# Patient Record
Sex: Female | Born: 1935 | Race: White | Hispanic: No | Smoking: Former smoker
Health system: Southern US, Community
[De-identification: ages and names within clinical notes are randomized; demographics above are authoritative.]

## PROBLEM LIST (undated history)

## (undated) DIAGNOSIS — C801 Malignant (primary) neoplasm, unspecified: Secondary | ICD-10-CM

## (undated) DIAGNOSIS — E039 Hypothyroidism, unspecified: Secondary | ICD-10-CM

## (undated) DIAGNOSIS — M1990 Unspecified osteoarthritis, unspecified site: Secondary | ICD-10-CM

## (undated) DIAGNOSIS — F329 Major depressive disorder, single episode, unspecified: Secondary | ICD-10-CM

## (undated) DIAGNOSIS — R51 Headache: Secondary | ICD-10-CM

## (undated) DIAGNOSIS — R42 Dizziness and giddiness: Secondary | ICD-10-CM

## (undated) DIAGNOSIS — R519 Headache, unspecified: Secondary | ICD-10-CM

## (undated) DIAGNOSIS — J45909 Unspecified asthma, uncomplicated: Secondary | ICD-10-CM

## (undated) DIAGNOSIS — F32A Depression, unspecified: Secondary | ICD-10-CM

## (undated) HISTORY — PX: COLONOSCOPY: SHX174

## (undated) HISTORY — PX: ABDOMINAL HYSTERECTOMY: SHX81

## (undated) HISTORY — PX: TONSILLECTOMY: SUR1361

---

## 2008-10-17 ENCOUNTER — Ambulatory Visit: Payer: Self-pay | Admitting: Internal Medicine

## 2009-10-19 ENCOUNTER — Ambulatory Visit: Payer: Self-pay | Admitting: Internal Medicine

## 2010-10-30 ENCOUNTER — Ambulatory Visit: Payer: Self-pay | Admitting: Internal Medicine

## 2011-12-17 ENCOUNTER — Ambulatory Visit: Payer: Self-pay | Admitting: Internal Medicine

## 2013-02-17 ENCOUNTER — Ambulatory Visit: Payer: Self-pay | Admitting: Internal Medicine

## 2013-02-24 ENCOUNTER — Ambulatory Visit: Payer: Self-pay | Admitting: Internal Medicine

## 2014-03-02 ENCOUNTER — Ambulatory Visit: Payer: Self-pay | Admitting: Internal Medicine

## 2015-03-09 ENCOUNTER — Other Ambulatory Visit: Payer: Self-pay | Admitting: Internal Medicine

## 2015-03-09 DIAGNOSIS — Z1231 Encounter for screening mammogram for malignant neoplasm of breast: Secondary | ICD-10-CM

## 2015-03-15 ENCOUNTER — Ambulatory Visit
Admission: RE | Admit: 2015-03-15 | Discharge: 2015-03-15 | Disposition: A | Discharge status: Home / Self Care | Payer: Medicare Other | Source: Ambulatory Visit | Attending: Internal Medicine | Admitting: Internal Medicine

## 2015-03-15 DIAGNOSIS — Z1231 Encounter for screening mammogram for malignant neoplasm of breast: Secondary | ICD-10-CM

## 2015-03-15 HISTORY — DX: Malignant (primary) neoplasm, unspecified: C80.1

## 2015-10-13 ENCOUNTER — Other Ambulatory Visit: Payer: Self-pay | Admitting: Internal Medicine

## 2015-10-13 DIAGNOSIS — M47812 Spondylosis without myelopathy or radiculopathy, cervical region: Secondary | ICD-10-CM

## 2015-10-13 DIAGNOSIS — M542 Cervicalgia: Secondary | ICD-10-CM

## 2015-10-17 ENCOUNTER — Ambulatory Visit
Admission: RE | Admit: 2015-10-17 | Discharge: 2015-10-17 | Disposition: A | Discharge status: Home / Self Care | Payer: Medicare Other | Source: Ambulatory Visit | Attending: Internal Medicine | Admitting: Internal Medicine

## 2015-10-17 DIAGNOSIS — G8929 Other chronic pain: Secondary | ICD-10-CM | POA: Diagnosis present

## 2015-10-17 DIAGNOSIS — M47812 Spondylosis without myelopathy or radiculopathy, cervical region: Secondary | ICD-10-CM | POA: Insufficient documentation

## 2015-10-17 DIAGNOSIS — M542 Cervicalgia: Secondary | ICD-10-CM | POA: Insufficient documentation

## 2015-10-17 DIAGNOSIS — M50323 Other cervical disc degeneration at C6-C7 level: Secondary | ICD-10-CM | POA: Insufficient documentation

## 2015-10-17 DIAGNOSIS — M1288 Other specific arthropathies, not elsewhere classified, other specified site: Secondary | ICD-10-CM | POA: Diagnosis present

## 2015-10-17 DIAGNOSIS — M50321 Other cervical disc degeneration at C4-C5 level: Secondary | ICD-10-CM | POA: Diagnosis not present

## 2016-03-08 ENCOUNTER — Other Ambulatory Visit: Payer: Self-pay | Admitting: Internal Medicine

## 2016-03-08 DIAGNOSIS — Z1231 Encounter for screening mammogram for malignant neoplasm of breast: Secondary | ICD-10-CM

## 2016-03-18 ENCOUNTER — Ambulatory Visit
Admission: RE | Admit: 2016-03-18 | Discharge: 2016-03-18 | Disposition: A | Discharge status: Home / Self Care | Payer: Medicare Other | Source: Ambulatory Visit | Attending: Internal Medicine | Admitting: Internal Medicine

## 2016-03-18 ENCOUNTER — Other Ambulatory Visit: Payer: Self-pay | Admitting: Internal Medicine

## 2016-03-18 DIAGNOSIS — Z1231 Encounter for screening mammogram for malignant neoplasm of breast: Secondary | ICD-10-CM | POA: Insufficient documentation

## 2016-08-16 ENCOUNTER — Encounter: Payer: Self-pay | Admitting: *Deleted

## 2016-08-20 NOTE — Discharge Instructions (Signed)
Cataract Surgery, Care After °Refer to this sheet in the next few weeks. These instructions provide you with information about caring for yourself after your procedure. Your health care provider may also give you more specific instructions. Your treatment has been planned according to current medical practices, but problems sometimes occur. Call your health care provider if you have any problems or questions after your procedure. °What can I expect after the procedure? °After the procedure, it is common to have: °· Itching. °· Discomfort. °· Fluid discharge. °· Sensitivity to light and to touch. °· Bruising. °Follow these instructions at home: °Eye Care  °· Check your eye every day for signs of infection. Watch for: °¨ Redness, swelling, or pain. °¨ Fluid, blood, or pus. °¨ Warmth. °¨ Bad smell. °Activity  °· Avoid strenuous activities, such as playing contact sports, for as long as told by your health care provider. °· Do not drive or operate heavy machinery until your health care provider approves. °· Do not bend or lift heavy objects . Bending increases pressure in the eye. You can walk, climb stairs, and do light household chores. °· Ask your health care provider when you can return to work. If you work in a dusty environment, you may be advised to wear protective eyewear for a period of time. °General instructions  °· Take or apply over-the-counter and prescription medicines only as told by your health care provider. This includes eye drops. °· Do not touch or rub your eyes. °· If you were given a protective shield, wear it as told by your health care provider. If you were not given a protective shield, wear sunglasses as told by your health care provider to protect your eyes. °· Keep the area around your eye clean and dry. Avoid swimming or allowing water to hit you directly in the face while showering until told by your health care provider. Keep soap and shampoo out of your eyes. °· Do not put a contact lens  into the affected eye or eyes until your health care provider approves. °· Keep all follow-up visits as told by your health care provider. This is important. °Contact a health care provider if: ° °· You have increased bruising around your eye. °· You have pain that is not helped with medicine. °· You have a fever. °· You have redness, swelling, or pain in your eye. °· You have fluid, blood, or pus coming from your incision. °· Your vision gets worse. °Get help right away if: °· You have sudden vision loss. °This information is not intended to replace advice given to you by your health care provider. Make sure you discuss any questions you have with your health care provider. °Document Released: 02/22/2005 Document Revised: 12/14/2015 Document Reviewed: 06/15/2015 °Elsevier Interactive Patient Education © 2017 [employer] ° ° ° ° °General Anesthesia, Adult, Care After °These instructions provide you with information about caring for yourself after your procedure. Your health care provider may also give you more specific instructions. Your treatment has been planned according to current medical practices, but problems sometimes occur. Call your health care provider if you have any problems or questions after your procedure. °What can I expect after the procedure? °After the procedure, it is common to have: °· Vomiting. °· A sore throat. °· Mental slowness. °It is common to feel: °· Nauseous. °· Cold or shivery. °· Sleepy. °· Tired. °· Sore or achy, even in parts of your body where you did not have surgery. °Follow these instructions at   home: °For at least 24 hours after the procedure:  °· Do not: °¨ Participate in activities where you could fall or become injured. °¨ Drive. °¨ Use heavy machinery. °¨ Drink alcohol. °¨ Take sleeping pills or medicines that cause drowsiness. °¨ Make important decisions or sign legal documents. °¨ Take care of children on your own. °· Rest. °Eating and drinking  °· If you vomit, drink  water, juice, or soup when you can drink without vomiting. °· Drink enough fluid to keep your urine clear or pale yellow. °· Make sure you have little or no nausea before eating solid foods. °· Follow the diet recommended by your health care provider. °General instructions  °· Have a responsible adult stay with you until you are awake and alert. °· Return to your normal activities as told by your health care provider. Ask your health care provider what activities are safe for you. °· Take over-the-counter and prescription medicines only as told by your health care provider. °· If you smoke, do not smoke without supervision. °· Keep all follow-up visits as told by your health care provider. This is important. °Contact a health care provider if: °· You continue to have nausea or vomiting at home, and medicines are not helpful. °· You cannot drink fluids or start eating again. °· You cannot urinate after 8-12 hours. °· You develop a skin rash. °· You have fever. °· You have increasing redness at the site of your procedure. °Get help right away if: °· You have difficulty breathing. °· You have chest pain. °· You have unexpected bleeding. °· You feel that you are having a life-threatening or urgent problem. °This information is not intended to replace advice given to you by your health care provider. Make sure you discuss any questions you have with your health care provider. °Document Released: 11/11/2000 Document Revised: 01/08/2016 Document Reviewed: 07/20/2015 °Elsevier Interactive Patient Education © 2017 [employer] ° °

## 2016-08-26 ENCOUNTER — Encounter
Admission: RE | Disposition: A | Discharge status: Home / Self Care | Payer: Self-pay | Source: Ambulatory Visit | Attending: Ophthalmology

## 2016-08-26 ENCOUNTER — Ambulatory Visit: Payer: Medicare Other | Admitting: Anesthesiology

## 2016-08-26 ENCOUNTER — Ambulatory Visit
Admission: RE | Admit: 2016-08-26 | Discharge: 2016-08-26 | Disposition: A | Discharge status: Home / Self Care | Payer: Medicare Other | Source: Ambulatory Visit | Attending: Ophthalmology | Admitting: Ophthalmology

## 2016-08-26 DIAGNOSIS — R51 Headache: Secondary | ICD-10-CM | POA: Insufficient documentation

## 2016-08-26 DIAGNOSIS — Z87891 Personal history of nicotine dependence: Secondary | ICD-10-CM | POA: Diagnosis not present

## 2016-08-26 DIAGNOSIS — H2511 Age-related nuclear cataract, right eye: Secondary | ICD-10-CM | POA: Diagnosis not present

## 2016-08-26 DIAGNOSIS — E039 Hypothyroidism, unspecified: Secondary | ICD-10-CM | POA: Insufficient documentation

## 2016-08-26 DIAGNOSIS — M1990 Unspecified osteoarthritis, unspecified site: Secondary | ICD-10-CM | POA: Insufficient documentation

## 2016-08-26 DIAGNOSIS — J45909 Unspecified asthma, uncomplicated: Secondary | ICD-10-CM | POA: Diagnosis not present

## 2016-08-26 HISTORY — DX: Hypothyroidism, unspecified: E03.9

## 2016-08-26 HISTORY — DX: Unspecified asthma, uncomplicated: J45.909

## 2016-08-26 HISTORY — DX: Headache: R51

## 2016-08-26 HISTORY — DX: Unspecified osteoarthritis, unspecified site: M19.90

## 2016-08-26 HISTORY — DX: Headache, unspecified: R51.9

## 2016-08-26 HISTORY — DX: Major depressive disorder, single episode, unspecified: F32.9

## 2016-08-26 HISTORY — DX: Depression, unspecified: F32.A

## 2016-08-26 HISTORY — PX: CATARACT EXTRACTION W/PHACO: SHX586

## 2016-08-26 HISTORY — DX: Dizziness and giddiness: R42

## 2016-08-26 SURGERY — PHACOEMULSIFICATION, CATARACT, WITH IOL INSERTION
Anesthesia: Monitor Anesthesia Care | Site: Eye | Laterality: Right | Wound class: Clean

## 2016-08-26 MED ORDER — CEFUROXIME OPHTHALMIC INJECTION 1 MG/0.1 ML
INJECTION | OPHTHALMIC | Status: DC | PRN
  Administered 2016-08-26: .3 mL via INTRACAMERAL

## 2016-08-26 MED ORDER — NA HYALUR & NA CHOND-NA HYALUR 0.4-0.35 ML IO KIT
PACK | INTRAOCULAR | Status: DC | PRN
  Administered 2016-08-26: 1 mL via INTRAOCULAR

## 2016-08-26 MED ORDER — FENTANYL CITRATE (PF) 100 MCG/2ML IJ SOLN
INTRAMUSCULAR | Status: DC | PRN
  Administered 2016-08-26: 50 ug via INTRAVENOUS

## 2016-08-26 MED ORDER — MIDAZOLAM HCL 2 MG/2ML IJ SOLN
INTRAMUSCULAR | Status: DC | PRN
  Administered 2016-08-26 (×2): 1 mg via INTRAVENOUS

## 2016-08-26 MED ORDER — LIDOCAINE HCL (PF) 4 % IJ SOLN
INTRAOCULAR | Status: DC | PRN
  Administered 2016-08-26: 1 mL via OPHTHALMIC

## 2016-08-26 MED ORDER — ARMC OPHTHALMIC DILATING DROPS
1.0000 "application " | OPHTHALMIC | Status: DC | PRN
  Administered 2016-08-26 (×3): 1 via OPHTHALMIC

## 2016-08-26 MED ORDER — MOXIFLOXACIN HCL 0.5 % OP SOLN
1.0000 [drp] | OPHTHALMIC | Status: DC | PRN
  Administered 2016-08-26 (×3): 1 [drp] via OPHTHALMIC

## 2016-08-26 MED ORDER — EPINEPHRINE PF 1 MG/ML IJ SOLN
INTRAOCULAR | Status: DC | PRN
  Administered 2016-08-26: 69 mL via OPHTHALMIC

## 2016-08-26 MED ORDER — TIMOLOL MALEATE 0.5 % OP SOLN
OPHTHALMIC | Status: DC | PRN
  Administered 2016-08-26: 1 [drp] via OPHTHALMIC

## 2016-08-26 SURGICAL SUPPLY — 29 items
APPLICATOR COTTON TIP 3IN (MISCELLANEOUS) ×3 IMPLANT
CANNULA ANT/CHMB 27GA (MISCELLANEOUS) ×3 IMPLANT
DISSECTOR HYDRO NUCLEUS 50X22 (MISCELLANEOUS) ×3 IMPLANT
GLOVE BIO SURGEON STRL SZ7 (GLOVE) ×3 IMPLANT
GLOVE SURG LX 6.5 MICRO (GLOVE) ×2
GLOVE SURG LX STRL 6.5 MICRO (GLOVE) ×1 IMPLANT
GOWN STRL REUS W/ TWL LRG LVL3 (GOWN DISPOSABLE) ×2 IMPLANT
GOWN STRL REUS W/TWL LRG LVL3 (GOWN DISPOSABLE) ×4
LENS IOL ACRSF IQ 11.5 (Intraocular Lens) ×1 IMPLANT
LENS IOL ACRYSOF IQ 11.5 (Intraocular Lens) ×3 IMPLANT
MARKER SKIN DUAL TIP RULER LAB (MISCELLANEOUS) ×3 IMPLANT
NEEDLE FILTER BLUNT 18X 1/2SAF (NEEDLE) ×4
NEEDLE FILTER BLUNT 18X1 1/2 (NEEDLE) ×2 IMPLANT
PACK CATARACT BRASINGTON (MISCELLANEOUS) ×3 IMPLANT
PACK EYE AFTER SURG (MISCELLANEOUS) ×3 IMPLANT
PACK OPTHALMIC (MISCELLANEOUS) ×3 IMPLANT
RING MALYGIN 7.0 (MISCELLANEOUS) IMPLANT
SOL BAL SALT 15ML (MISCELLANEOUS)
SOLUTION BAL SALT 15ML (MISCELLANEOUS) IMPLANT
SUT ETHILON 10-0 CS-B-6CS-B-6 (SUTURE)
SUT VICRYL  9 0 (SUTURE)
SUT VICRYL 9 0 (SUTURE) IMPLANT
SUTURE EHLN 10-0 CS-B-6CS-B-6 (SUTURE) IMPLANT
SYR 3ML LL SCALE MARK (SYRINGE) ×6 IMPLANT
SYR TB 1ML LUER SLIP (SYRINGE) ×3 IMPLANT
WATER STERILE IRR 250ML POUR (IV SOLUTION) ×3 IMPLANT
WATER STERILE IRR 500ML POUR (IV SOLUTION) IMPLANT
WICK EYE OCUCEL (MISCELLANEOUS) IMPLANT
WIPE NON LINTING 3.25X3.25 (MISCELLANEOUS) ×3 IMPLANT

## 2016-08-26 NOTE — Anesthesia Procedure Notes (Signed)
Procedure Name: MAC Date/Time: 08/26/2016 10:11 AM Performed by: Janna Arch Pre-anesthesia Checklist: Patient identified, Emergency Drugs available, Suction available and Patient being monitored Patient Re-evaluated:Patient Re-evaluated prior to inductionOxygen Delivery Method: Nasal cannula

## 2016-08-26 NOTE — Op Note (Signed)
Date of Surgery: 08/26/2016  PREOPERATIVE DIAGNOSES: Visually significant nuclear sclerotic cataract, right eye.  POSTOPERATIVE DIAGNOSES: Same  PROCEDURES PERFORMED: Cataract extraction with intraocular lens implant, right eye.  SURGEON: Almon Hercules, M.D.  ANESTHESIA: MAC and topical  IMPLANTS: [id number] +11.5 D  Implant Name Type Inv. Item Serial No. Manufacturer Lot No. LRB No. Used  LENS IOL ACRYSOF IQ 11.5 - [id number] Intraocular Lens LENS IOL ACRYSOF IQ 11.5 [id number] [employer]   Right 1     COMPLICATIONS: None.  DESCRIPTION OF PROCEDURE: Therapeutic options were discussed with the patient preoperatively, including a discussion of risks and benefits of surgery. Informed consent was obtained. An IOL-Master and immersion biometry were used to take the lens measurements, and a dilated fundus exam was performed within 6 months of the surgical date.  The patient was premedicated and brought to the operating room and placed on the operating table in the supine position. After adequate anesthesia, the patient was prepped and draped in the usual sterile ophthalmic fashion. A wire lid speculum was inserted and the microscope was positioned. A Superblade was used to create a paracentesis site at the limbus and a small amount of dilute preservative free lidocaine was instilled into the anterior chamber, followed by dispersive viscoelastic. A clear corneal incision was created temporally using a 2.4 mm keratome blade. Capsulorrhexis was then performed. In situ phacoemulsification was performed.  Cortical material was removed with the irrigation-aspiration unit. Dispersive viscoelastic was instilled to open the capsular bag. A posterior chamber intraocular lens with the specifications above was inserted and positioned. Irrigation-aspiration was used to remove all viscoelastic. Cefuroxime 1cc was instilled into the anterior chamber, and the corneal incision was checked and found to be water tight. The  eyelid speculum was removed.  The operative eye was covered with protective goggles after instilling 1 drop of timolol. The patient tolerated the procedure well. There were no complications.

## 2016-08-26 NOTE — H&P (Signed)
H+P reviewed and is up to date, please see paper chart.  

## 2016-08-26 NOTE — Anesthesia Postprocedure Evaluation (Signed)
Anesthesia Post Note  Patient: Melissa Preston  Procedure(s) Performed: Procedure(s) (LRB): CATARACT EXTRACTION PHACO AND INTRAOCULAR LENS PLACEMENT (IOC) (Right)  Patient location during evaluation: PACU Anesthesia Type: MAC Level of consciousness: awake and alert Pain management: pain level controlled Vital Signs Assessment: post-procedure vital signs reviewed and stable Respiratory status: spontaneous breathing, nonlabored ventilation, respiratory function stable and patient connected to nasal cannula oxygen Cardiovascular status: stable and blood pressure returned to baseline Anesthetic complications: no    Alisa Graff

## 2016-08-26 NOTE — Transfer of Care (Signed)
Immediate Anesthesia Transfer of Care Note  Patient: Melissa Preston  Procedure(s) Performed: Procedure(s) with comments: CATARACT EXTRACTION PHACO AND INTRAOCULAR LENS PLACEMENT (IOC) (Right) - right  Patient Location: PACU  Anesthesia Type: MAC  Level of Consciousness: awake, alert  and patient cooperative  Airway and Oxygen Therapy: Patient Spontanous Breathing and Patient connected to supplemental oxygen  Post-op Assessment: Post-op Vital signs reviewed, Patient's Cardiovascular Status Stable, Respiratory Function Stable, Patent Airway and No signs of Nausea or vomiting  Post-op Vital Signs: Reviewed and stable  Complications: No apparent anesthesia complications

## 2016-08-26 NOTE — Anesthesia Preprocedure Evaluation (Signed)
Anesthesia Evaluation  Patient identified by MRN, date of birth, ID band Patient awake    Reviewed: Allergy & Precautions, H&P , NPO status , Patient's Chart, lab work & pertinent test results, reviewed documented beta blocker date and time   Airway Mallampati: II  TM Distance: >3 FB Neck ROM: full    Dental no notable dental hx.    Pulmonary asthma , former smoker,    Pulmonary exam normal breath sounds clear to auscultation       Cardiovascular Exercise Tolerance: Good negative cardio ROS   Rhythm:regular Rate:Normal     Neuro/Psych  Headaches, negative psych ROS   GI/Hepatic negative GI ROS, Neg liver ROS,   Endo/Other  Hypothyroidism   Renal/GU negative Renal ROS  negative genitourinary   Musculoskeletal  (+) Arthritis ,   Abdominal   Peds  Hematology negative hematology ROS (+)   Anesthesia Other Findings   Reproductive/Obstetrics negative OB ROS                             Anesthesia Physical Anesthesia Plan  ASA: II  Anesthesia Plan: MAC   Post-op Pain Management:    Induction:   Airway Management Planned:   Additional Equipment:   Intra-op Plan:   Post-operative Plan:   Informed Consent: I have reviewed the patients History and Physical, chart, labs and discussed the procedure including the risks, benefits and alternatives for the proposed anesthesia with the patient or authorized representative who has indicated his/her understanding and acceptance.   Dental Advisory Given  Plan Discussed with: CRNA  Anesthesia Plan Comments:         Anesthesia Quick Evaluation

## 2016-08-27 ENCOUNTER — Encounter: Payer: Self-pay | Admitting: Ophthalmology

## 2016-09-16 ENCOUNTER — Encounter: Payer: Self-pay | Admitting: *Deleted

## 2016-09-19 NOTE — Discharge Instructions (Signed)
Cataract Surgery, Care After °Refer to this sheet in the next few weeks. These instructions provide you with information about caring for yourself after your procedure. Your health care provider may also give you more specific instructions. Your treatment has been planned according to current medical practices, but problems sometimes occur. Call your health care provider if you have any problems or questions after your procedure. °What can I expect after the procedure? °After the procedure, it is common to have: °· Itching. °· Discomfort. °· Fluid discharge. °· Sensitivity to light and to touch. °· Bruising. °Follow these instructions at home: °Eye Care  °· Check your eye every day for signs of infection. Watch for: °¨ Redness, swelling, or pain. °¨ Fluid, blood, or pus. °¨ Warmth. °¨ Bad smell. °Activity  °· Avoid strenuous activities, such as playing contact sports, for as long as told by your health care provider. °· Do not drive or operate heavy machinery until your health care provider approves. °· Do not bend or lift heavy objects . Bending increases pressure in the eye. You can walk, climb stairs, and do light household chores. °· Ask your health care provider when you can return to work. If you work in a dusty environment, you may be advised to wear protective eyewear for a period of time. °General instructions  °· Take or apply over-the-counter and prescription medicines only as told by your health care provider. This includes eye drops. °· Do not touch or rub your eyes. °· If you were given a protective shield, wear it as told by your health care provider. If you were not given a protective shield, wear sunglasses as told by your health care provider to protect your eyes. °· Keep the area around your eye clean and dry. Avoid swimming or allowing water to hit you directly in the face while showering until told by your health care provider. Keep soap and shampoo out of your eyes. °· Do not put a contact lens  into the affected eye or eyes until your health care provider approves. °· Keep all follow-up visits as told by your health care provider. This is important. °Contact a health care provider if: ° °· You have increased bruising around your eye. °· You have pain that is not helped with medicine. °· You have a fever. °· You have redness, swelling, or pain in your eye. °· You have fluid, blood, or pus coming from your incision. °· Your vision gets worse. °Get help right away if: °· You have sudden vision loss. °This information is not intended to replace advice given to you by your health care provider. Make sure you discuss any questions you have with your health care provider. °Document Released: 02/22/2005 Document Revised: 12/14/2015 Document Reviewed: 06/15/2015 °Elsevier Interactive Patient Education © 2017 [employer] ° ° ° ° °General Anesthesia, Adult, Care After °These instructions provide you with information about caring for yourself after your procedure. Your health care provider may also give you more specific instructions. Your treatment has been planned according to current medical practices, but problems sometimes occur. Call your health care provider if you have any problems or questions after your procedure. °What can I expect after the procedure? °After the procedure, it is common to have: °· Vomiting. °· A sore throat. °· Mental slowness. °It is common to feel: °· Nauseous. °· Cold or shivery. °· Sleepy. °· Tired. °· Sore or achy, even in parts of your body where you did not have surgery. °Follow these instructions at   home: °For at least 24 hours after the procedure:  °· Do not: °¨ Participate in activities where you could fall or become injured. °¨ Drive. °¨ Use heavy machinery. °¨ Drink alcohol. °¨ Take sleeping pills or medicines that cause drowsiness. °¨ Make important decisions or sign legal documents. °¨ Take care of children on your own. °· Rest. °Eating and drinking  °· If you vomit, drink  water, juice, or soup when you can drink without vomiting. °· Drink enough fluid to keep your urine clear or pale yellow. °· Make sure you have little or no nausea before eating solid foods. °· Follow the diet recommended by your health care provider. °General instructions  °· Have a responsible adult stay with you until you are awake and alert. °· Return to your normal activities as told by your health care provider. Ask your health care provider what activities are safe for you. °· Take over-the-counter and prescription medicines only as told by your health care provider. °· If you smoke, do not smoke without supervision. °· Keep all follow-up visits as told by your health care provider. This is important. °Contact a health care provider if: °· You continue to have nausea or vomiting at home, and medicines are not helpful. °· You cannot drink fluids or start eating again. °· You cannot urinate after 8-12 hours. °· You develop a skin rash. °· You have fever. °· You have increasing redness at the site of your procedure. °Get help right away if: °· You have difficulty breathing. °· You have chest pain. °· You have unexpected bleeding. °· You feel that you are having a life-threatening or urgent problem. °This information is not intended to replace advice given to you by your health care provider. Make sure you discuss any questions you have with your health care provider. °Document Released: 11/11/2000 Document Revised: 01/08/2016 Document Reviewed: 07/20/2015 °Elsevier Interactive Patient Education © 2017 [employer] ° °

## 2016-09-23 ENCOUNTER — Ambulatory Visit: Payer: Medicare Other | Admitting: Anesthesiology

## 2016-09-23 ENCOUNTER — Encounter
Admission: RE | Disposition: A | Discharge status: Home / Self Care | Payer: Self-pay | Source: Ambulatory Visit | Attending: Ophthalmology

## 2016-09-23 ENCOUNTER — Ambulatory Visit
Admission: RE | Admit: 2016-09-23 | Discharge: 2016-09-23 | Disposition: A | Discharge status: Home / Self Care | Payer: Medicare Other | Source: Ambulatory Visit | Attending: Ophthalmology | Admitting: Ophthalmology

## 2016-09-23 DIAGNOSIS — J45909 Unspecified asthma, uncomplicated: Secondary | ICD-10-CM | POA: Insufficient documentation

## 2016-09-23 DIAGNOSIS — E039 Hypothyroidism, unspecified: Secondary | ICD-10-CM | POA: Diagnosis not present

## 2016-09-23 DIAGNOSIS — R51 Headache: Secondary | ICD-10-CM | POA: Insufficient documentation

## 2016-09-23 DIAGNOSIS — H2512 Age-related nuclear cataract, left eye: Secondary | ICD-10-CM | POA: Insufficient documentation

## 2016-09-23 DIAGNOSIS — Z87891 Personal history of nicotine dependence: Secondary | ICD-10-CM | POA: Insufficient documentation

## 2016-09-23 HISTORY — PX: CATARACT EXTRACTION W/PHACO: SHX586

## 2016-09-23 SURGERY — PHACOEMULSIFICATION, CATARACT, WITH IOL INSERTION
Anesthesia: Monitor Anesthesia Care | Laterality: Left | Wound class: Clean

## 2016-09-23 MED ORDER — MIDAZOLAM HCL 2 MG/2ML IJ SOLN
INTRAMUSCULAR | Status: DC | PRN
  Administered 2016-09-23: 2 mg via INTRAVENOUS

## 2016-09-23 MED ORDER — MOXIFLOXACIN HCL 0.5 % OP SOLN
1.0000 [drp] | OPHTHALMIC | Status: DC | PRN
  Administered 2016-09-23 (×3): 1 [drp] via OPHTHALMIC

## 2016-09-23 MED ORDER — EPINEPHRINE PF 1 MG/ML IJ SOLN
INTRAOCULAR | Status: DC | PRN
  Administered 2016-09-23: 75 mL via OPHTHALMIC

## 2016-09-23 MED ORDER — CEFUROXIME OPHTHALMIC INJECTION 1 MG/0.1 ML
INJECTION | OPHTHALMIC | Status: DC | PRN
  Administered 2016-09-23: 0.1 mL via INTRACAMERAL

## 2016-09-23 MED ORDER — NA HYALUR & NA CHOND-NA HYALUR 0.4-0.35 ML IO KIT
PACK | INTRAOCULAR | Status: DC | PRN
  Administered 2016-09-23: 1 mL via INTRAOCULAR

## 2016-09-23 MED ORDER — FENTANYL CITRATE (PF) 100 MCG/2ML IJ SOLN
INTRAMUSCULAR | Status: DC | PRN
  Administered 2016-09-23: 50 ug via INTRAVENOUS

## 2016-09-23 MED ORDER — LIDOCAINE HCL (PF) 2 % IJ SOLN
INTRAMUSCULAR | Status: DC | PRN
  Administered 2016-09-23: .2 mL

## 2016-09-23 MED ORDER — BRIMONIDINE TARTRATE-TIMOLOL 0.2-0.5 % OP SOLN
OPHTHALMIC | Status: DC | PRN
Start: 2016-09-23 — End: 2016-09-23
  Administered 2016-09-23: 1 [drp] via OPHTHALMIC

## 2016-09-23 MED ORDER — ARMC OPHTHALMIC DILATING DROPS
1.0000 "application " | OPHTHALMIC | Status: DC | PRN
  Administered 2016-09-23 (×3): 1 via OPHTHALMIC

## 2016-09-23 SURGICAL SUPPLY — 23 items
APPLICATOR COTTON TIP 3IN (MISCELLANEOUS) ×3 IMPLANT
CANNULA ANT/CHMB 27GA (MISCELLANEOUS) ×3 IMPLANT
DISSECTOR HYDRO NUCLEUS 50X22 (MISCELLANEOUS) ×3 IMPLANT
GLOVE BIO SURGEON STRL SZ7 (GLOVE) ×3 IMPLANT
GLOVE SURG LX 6.5 MICRO (GLOVE) ×2
GLOVE SURG LX STRL 6.5 MICRO (GLOVE) ×1 IMPLANT
GOWN STRL REUS W/ TWL LRG LVL3 (GOWN DISPOSABLE) ×2 IMPLANT
GOWN STRL REUS W/TWL LRG LVL3 (GOWN DISPOSABLE) ×4
LENS IOL ACRSF IQ ULTRA 9.5 (Intraocular Lens) ×1 IMPLANT
LENS IOL ACRYSOF IQ 9.5 (Intraocular Lens) ×3 IMPLANT
MARKER SKIN DUAL TIP RULER LAB (MISCELLANEOUS) ×3 IMPLANT
PACK CATARACT BRASINGTON (MISCELLANEOUS) ×3 IMPLANT
PACK EYE AFTER SURG (MISCELLANEOUS) ×3 IMPLANT
PACK OPTHALMIC (MISCELLANEOUS) ×3 IMPLANT
RING MALYGIN 7.0 (MISCELLANEOUS) IMPLANT
SUT ETHILON 10-0 CS-B-6CS-B-6 (SUTURE)
SUT VICRYL  9 0 (SUTURE)
SUT VICRYL 9 0 (SUTURE) IMPLANT
SUTURE EHLN 10-0 CS-B-6CS-B-6 (SUTURE) IMPLANT
SYR TB 1ML LUER SLIP (SYRINGE) ×3 IMPLANT
WATER STERILE IRR 250ML POUR (IV SOLUTION) ×3 IMPLANT
WICK EYE OCUCEL (MISCELLANEOUS) IMPLANT
WIPE NON LINTING 3.25X3.25 (MISCELLANEOUS) ×3 IMPLANT

## 2016-09-23 NOTE — Transfer of Care (Signed)
Immediate Anesthesia Transfer of Care Note  Patient: Melissa Preston  Procedure(s) Performed: Procedure(s): CATARACT EXTRACTION PHACO AND INTRAOCULAR LENS PLACEMENT (IOC)  left (Left)  Patient Location: PACU  Anesthesia Type: MAC  Level of Consciousness: awake, alert  and patient cooperative  Airway and Oxygen Therapy: Patient Spontanous Breathing and Patient connected to supplemental oxygen  Post-op Assessment: Post-op Vital signs reviewed, Patient's Cardiovascular Status Stable, Respiratory Function Stable, Patent Airway and No signs of Nausea or vomiting  Post-op Vital Signs: Reviewed and stable  Complications: No apparent anesthesia complications

## 2016-09-23 NOTE — Anesthesia Procedure Notes (Signed)
Procedure Name: MAC Performed by: Mayme Genta Pre-anesthesia Checklist: Patient identified, Emergency Drugs available, Suction available, Timeout performed and Patient being monitored Patient Re-evaluated:Patient Re-evaluated prior to inductionOxygen Delivery Method: Nasal cannula Placement Confirmation: positive ETCO2

## 2016-09-23 NOTE — Anesthesia Preprocedure Evaluation (Signed)
Anesthesia Evaluation  Patient identified by MRN, date of birth, ID band Patient awake    Reviewed: Allergy & Precautions, H&P , NPO status , Patient's Chart, lab work & pertinent test results, reviewed documented beta blocker date and time   Airway Mallampati: II  TM Distance: >3 FB Neck ROM: full    Dental no notable dental hx.    Pulmonary asthma , former smoker,    Pulmonary exam normal breath sounds clear to auscultation       Cardiovascular Exercise Tolerance: Good negative cardio ROS   Rhythm:regular Rate:Normal     Neuro/Psych  Headaches, negative psych ROS   GI/Hepatic negative GI ROS, Neg liver ROS,   Endo/Other  Hypothyroidism   Renal/GU negative Renal ROS  negative genitourinary   Musculoskeletal   Abdominal   Peds  Hematology negative hematology ROS (+)   Anesthesia Other Findings   Reproductive/Obstetrics negative OB ROS                             Anesthesia Physical Anesthesia Plan  ASA: II  Anesthesia Plan: MAC   Post-op Pain Management:    Induction:   Airway Management Planned:   Additional Equipment:   Intra-op Plan:   Post-operative Plan:   Informed Consent: I have reviewed the patients History and Physical, chart, labs and discussed the procedure including the risks, benefits and alternatives for the proposed anesthesia with the patient or authorized representative who has indicated his/her understanding and acceptance.   Dental Advisory Given  Plan Discussed with: CRNA  Anesthesia Plan Comments:         Anesthesia Quick Evaluation

## 2016-09-23 NOTE — H&P (Signed)
H+P reviewed and is up to date, please see paper chart.  

## 2016-09-23 NOTE — Op Note (Signed)
Date of Surgery: 09/23/2016  PREOPERATIVE DIAGNOSES: Visually significant nuclear sclerotic cataract, left eye.  POSTOPERATIVE DIAGNOSES: Same  PROCEDURES PERFORMED: Cataract extraction with intraocular lens implant, left eye.  SURGEON: Almon Hercules, M.D.  ANESTHESIA: MAC and topical  IMPLANTS: [id number] +9.5 D  Implant Name Type Inv. Item Serial No. Manufacturer Lot No. LRB No. Used  LENS IOL ACRYSOF IQ 9.5 - [id number] Intraocular Lens LENS IOL ACRYSOF IQ 9.5 [id number] [employer]   Left 1    COMPLICATIONS: Mild iris bleeding.  DESCRIPTION OF PROCEDURE: Therapeutic options were discussed with the patient preoperatively, including a discussion of risks and benefits of surgery. Informed consent was obtained. An IOL-Master and immersion biometry were used to take the lens measurements, and a dilated fundus exam was performed within 6 months of the surgical date.  The patient was premedicated and brought to the operating room and placed on the operating table in the supine position. After adequate anesthesia, the patient was prepped and draped in the usual sterile ophthalmic fashion. A wire lid speculum was inserted and the microscope was positioned. A Superblade was used to create a paracentesis site at the limbus and a small amount of dilute preservative free lidocaine was instilled into the anterior chamber, followed by dispersive viscoelastic. A clear corneal incision was created temporally using a 2.4 mm keratome blade. Capsulorrhexis was then performed. In situ phacoemulsification was performed.  Cortical material was removed with the irrigation-aspiration unit. Dispersive viscoelastic was instilled to open the capsular bag. A posterior chamber intraocular lens with the specifications above was inserted and positioned but the lens injector had unusual resistance with sudden give which caused slight bleeding from the nasal iris root. Irrigation-aspiration was used to remove all viscoelastic and  blood. Cefuroxime 1cc was instilled into the anterior chamber, and the corneal incision was checked and found to be water tight. The anterior chamber was watched and did not fill with blood.  The eyelid speculum was removed.  The operative eye was covered with protective goggles after instilling 1 drop of Combigan. The patient tolerated the procedure well.

## 2016-09-23 NOTE — Anesthesia Postprocedure Evaluation (Signed)
Anesthesia Post Note  Patient: Melissa Preston  Procedure(s) Performed: Procedure(s) (LRB): CATARACT EXTRACTION PHACO AND INTRAOCULAR LENS PLACEMENT (IOC)  left (Left)  Patient location during evaluation: PACU Anesthesia Type: MAC Level of consciousness: awake and alert Pain management: pain level controlled Vital Signs Assessment: post-procedure vital signs reviewed and stable Respiratory status: spontaneous breathing, nonlabored ventilation, respiratory function stable and patient connected to nasal cannula oxygen Cardiovascular status: stable and blood pressure returned to baseline Anesthetic complications: no    Alisa Graff

## 2016-09-24 ENCOUNTER — Encounter: Payer: Self-pay | Admitting: Ophthalmology

## 2017-02-18 ENCOUNTER — Other Ambulatory Visit: Payer: Self-pay | Admitting: Internal Medicine

## 2017-02-18 DIAGNOSIS — Z1231 Encounter for screening mammogram for malignant neoplasm of breast: Secondary | ICD-10-CM

## 2017-03-19 ENCOUNTER — Ambulatory Visit
Admission: RE | Admit: 2017-03-19 | Discharge: 2017-03-19 | Disposition: A | Discharge status: Home / Self Care | Payer: Medicare Other | Source: Ambulatory Visit | Attending: Internal Medicine | Admitting: Internal Medicine

## 2017-03-19 DIAGNOSIS — Z1231 Encounter for screening mammogram for malignant neoplasm of breast: Secondary | ICD-10-CM | POA: Insufficient documentation

## 2018-03-11 ENCOUNTER — Other Ambulatory Visit: Payer: Self-pay | Admitting: Internal Medicine

## 2018-03-11 DIAGNOSIS — Z1231 Encounter for screening mammogram for malignant neoplasm of breast: Secondary | ICD-10-CM

## 2018-03-11 DIAGNOSIS — Z803 Family history of malignant neoplasm of breast: Secondary | ICD-10-CM

## 2018-04-08 ENCOUNTER — Ambulatory Visit
Admission: RE | Admit: 2018-04-08 | Discharge: 2018-04-08 | Disposition: A | Discharge status: Home / Self Care | Payer: Medicare Other | Source: Ambulatory Visit | Attending: Internal Medicine | Admitting: Internal Medicine

## 2018-04-08 DIAGNOSIS — Z803 Family history of malignant neoplasm of breast: Secondary | ICD-10-CM | POA: Insufficient documentation

## 2018-04-08 DIAGNOSIS — Z1231 Encounter for screening mammogram for malignant neoplasm of breast: Secondary | ICD-10-CM | POA: Diagnosis not present

## 2021-10-01 ENCOUNTER — Other Ambulatory Visit: Payer: Self-pay | Admitting: Gerontology

## 2021-10-01 DIAGNOSIS — Z1231 Encounter for screening mammogram for malignant neoplasm of breast: Secondary | ICD-10-CM

## 2021-11-26 ENCOUNTER — Ambulatory Visit
Admission: RE | Admit: 2021-11-26 | Discharge: 2021-11-26 | Disposition: A | Discharge status: Home / Self Care | Payer: Medicare Other | Source: Ambulatory Visit | Attending: Gerontology | Admitting: Gerontology

## 2021-11-26 DIAGNOSIS — Z1231 Encounter for screening mammogram for malignant neoplasm of breast: Secondary | ICD-10-CM | POA: Diagnosis present

## 2022-03-01 ENCOUNTER — Ambulatory Visit
Admission: EM | Admit: 2022-03-01 | Discharge: 2022-03-01 | Disposition: A | Discharge status: Home / Self Care | Payer: Medicare Other

## 2022-03-01 ENCOUNTER — Ambulatory Visit (INDEPENDENT_AMBULATORY_CARE_PROVIDER_SITE_OTHER): Payer: Medicare Other

## 2022-03-01 DIAGNOSIS — S52501A Unspecified fracture of the lower end of right radius, initial encounter for closed fracture: Secondary | ICD-10-CM

## 2022-03-01 NOTE — ED Triage Notes (Signed)
Patient present to UC for a fall.  She reports she went bowling last night  and lost her balance and landed on her Right hand.   Patient c/o pain in her wrist and hand.

## 2022-03-01 NOTE — Discharge Instructions (Addendum)
You fractured your radius, one of the main bones of your forearm at the level of your wrist. Please keep the splint on until seen by ortho. Do not use your right arm/ hand. You can continue use of the tylenol, up to '1000mg'$  three times daily. Do not exceed 6 tabs in 24 hours. Ice the area three times daily. You can use your home Rx of meloxicam if needed, do not exceed '15mg'$  in 24 hours. Please call ortho today to schedule a follow up on Monday. If you cannot get an appointment on Monday, please head to [facility].   If you develop any decreased sensation, worsening pain, or coolness of fingers, head to ER immediately.

## 2022-03-01 NOTE — ED Provider Notes (Signed)
[facility]    CSN: [id number] Arrival date & time: 03/01/22  1010      History   Chief Complaint Chief Complaint  Patient presents with   Fall   Hand Injury   Wrist Pain    HPI Melissa Preston is a 88 y.o. female.   Pleasant 88 year old female presents today due to right wrist injury that occurred last evening.  She states she was bowling, and her shoe got caught on the floor.  She fell catching herself with her right hand outstretched behind her.  She states there was immediate pain bruising and swelling.  She reports that range of motion of the wrist worsens the pain.  She is currently at a 4 out of 10 pain and states that Tylenol has been helping.  She denies any loss of sensation.   Fall  Hand Injury Wrist Pain    Past Medical History:  Diagnosis Date   Arthritis    neck   Asthma    Cancer ([place])    SKIN CA   Depression    Headache    migraines, sinus   Hypothyroidism    Vertigo     There are no problems to display for this patient.   Past Surgical History:  Procedure Laterality Date   ABDOMINAL HYSTERECTOMY     CATARACT EXTRACTION W/PHACO Right 08/26/2016   Procedure: CATARACT EXTRACTION PHACO AND INTRAOCULAR LENS PLACEMENT (IOC);  Surgeon: Ronnell Freshwater, MD;  Location: [place];  Service: Ophthalmology;  Laterality: Right;  right   CATARACT EXTRACTION W/PHACO Left 09/23/2016   Procedure: CATARACT EXTRACTION PHACO AND INTRAOCULAR LENS PLACEMENT ([place])  left;  Surgeon: Ronnell Freshwater, MD;  Location: [place];  Service: Ophthalmology;  Laterality: Left;   COLONOSCOPY     TONSILLECTOMY      OB History   No obstetric history on file.      Home Medications    Prior to Admission medications   Medication Sig Start Date End Date Taking? Authorizing Provider  loratadine (CLARITIN) 10 MG tablet Take 10 mg by mouth daily as needed for allergies.   Yes [provider]  meloxicam (MOBIC) 7.5  MG tablet Take 7.5 mg by mouth daily.   Yes [provider]  Probiotic Product (ALIGN PO) Take by mouth daily.   Yes [provider]  sertraline (ZOLOFT) 50 MG tablet Take 50 mg by mouth daily.   Yes [provider]  dorzolamide (TRUSOPT) 2 % ophthalmic solution 1 drop 2 (two) times daily. 02/11/22   [provider]    Family History Family History  Problem Relation Age of Onset   Breast cancer Sister 19   Breast cancer Cousin        pat cousin    Social History Social History   Tobacco Use   Smoking status: Former    Types: Cigarettes    Quit date: 08/19/1958    Years since quitting: 63.5   Smokeless tobacco: Never  Vaping Use   Vaping Use: Never used  Substance Use Topics   Alcohol use: Yes    Alcohol/week: 14.0 standard drinks of alcohol    Types: 14 Glasses of wine per week     Allergies   Alphagan [brimonidine], Shellfish allergy, and Vicodin [hydrocodone-acetaminophen]   Review of Systems Review of Systems R wrist pain AS PER HPI  Physical Exam Triage Vital Signs ED Triage Vitals  Enc Vitals Group     BP 03/01/22 1039 (!)  150/55     Pulse Rate 03/01/22 1039 (!) 58     Resp 03/01/22 1039 16     Temp 03/01/22 1039 97.7 F (36.5 C)     Temp Source 03/01/22 1039 Oral     SpO2 03/01/22 1039 100 %     Weight 03/01/22 1035 140 lb (63.5 kg)     Height 03/01/22 1035 '5\' 4"'$  (1.626 m)     Head Circumference --      Peak Flow --      Pain Score 03/01/22 1035 5     Pain Loc --      Pain Edu? --      Excl. in [place]? --    No data found.  Updated Vital Signs BP (!) 150/55 (BP Location: Left Arm)   Pulse (!) 58   Temp 97.7 F (36.5 C) (Oral)   Resp 16   Ht '5\' 4"'$  (1.626 m)   Wt 140 lb (63.5 kg)   SpO2 100%   BMI 24.03 kg/m   Visual Acuity Right Eye Distance:   Left Eye Distance:   Bilateral Distance:    Right Eye Near:   Left Eye Near:    Bilateral Near:     Physical Exam Vitals and nursing note reviewed.   Constitutional:      General: She is not in acute distress.    Appearance: Normal appearance. She is normal weight. She is not ill-appearing or toxic-appearing.  HENT:     Head: Normocephalic and atraumatic.  Pulmonary:     Effort: Pulmonary effort is normal. No respiratory distress.  Musculoskeletal:        General: Swelling, tenderness (R wrist) and signs of injury (significant ecchymosis extending from distal forearm to MCP joints on hand, ecchymosis noted dorsally only) present. Normal range of motion.     Comments: FROM of wrist, elbow and fingers, however pain with flexion/extension at wrist No snuffbox tenderness Normal sensation and pulses  Cap refill <2 sec  Skin:    General: Skin is warm.     Capillary Refill: Capillary refill takes less than 2 seconds.     Findings: Bruising present.  Neurological:     General: No focal deficit present.     Mental Status: She is alert and oriented to person, place, and time.     Sensory: No sensory deficit.     Motor: No weakness.      UC Treatments / Results  Labs (all labs ordered are listed, but only abnormal results are displayed) Labs Reviewed - No data to display  EKG   Radiology DG Wrist Complete Right  Result Date: 03/01/2022 CLINICAL DATA:  Fall. Radial side pain. EXAM: RIGHT WRIST - COMPLETE 3+ VIEW COMPARISON:  None Available. FINDINGS: And leads placed fracture is present along the lateral cortex of the distal radial metaphysis. No definite ulnar fracture is present. Carpal bones are intact. Chondrocalcinosis noted. Degenerative osteoarthritic changes are also present, most notably at the first [facility] joint. IMPRESSION: 1. Nondisplaced fracture involving the lateral cortex of the distal radial metaphysis. 2. Degenerative changes. These results will be called to the ordering clinician or representative by the Radiologist Assistant, and communication documented in the PACS or [employer]. Electronically Signed   By:  San Morelle M.D.   On: 03/01/2022 11:10    Procedures Procedures (including critical care time)  Medications Ordered in UC Medications - No data to display  Initial Impression / Assessment and Plan / UC Course  I  have reviewed the triage vital signs and the nursing notes.  Pertinent labs & imaging results that were available during my care of the patient were reviewed by me and considered in my medical decision making (see chart for details).      Closed distal radial fracture, non-displaced - Pt reports pain currently controlled with tylenol alone. Does have script for meloxicam at home. Offered vicodin, she states it makes her itchy and would rather not take. Pt placed in sugar tong splint, recommended ortho follow up by Monday. If unable to have scheduled appointment, follow up with ortho UC appropriate. Ice as indicated. ER precautions discussed   Final Clinical Impressions(s) / UC Diagnoses   Final diagnoses:  Closed fracture of distal end of right radius, unspecified fracture morphology, initial encounter     Discharge Instructions      You fractured your radius, one of the main bones of your forearm at the level of your wrist. Please keep the splint on until seen by ortho. Do not use your right arm/ hand. You can continue use of the tylenol, up to '1000mg'$  three times daily. Do not exceed 6 tabs in 24 hours. Ice the area three times daily. You can use your home Rx of meloxicam if needed, do not exceed '15mg'$  in 24 hours. Please call ortho today to schedule a follow up on Monday. If you cannot get an appointment on Monday, please head to [facility].   If you develop any decreased sensation, worsening pain, or coolness of fingers, head to ER immediately.    ED Prescriptions   None    PDMP not reviewed this encounter.   Chaney Malling, [place] 03/01/22 1205

## 2022-05-17 ENCOUNTER — Encounter: Payer: Self-pay | Admitting: Occupational Therapy

## 2022-05-17 ENCOUNTER — Ambulatory Visit: Payer: Medicare Other | Attending: Orthopedic Surgery | Admitting: Occupational Therapy

## 2022-05-17 DIAGNOSIS — M6281 Muscle weakness (generalized): Secondary | ICD-10-CM | POA: Diagnosis present

## 2022-05-17 DIAGNOSIS — M79641 Pain in right hand: Secondary | ICD-10-CM | POA: Insufficient documentation

## 2022-05-17 DIAGNOSIS — M25631 Stiffness of right wrist, not elsewhere classified: Secondary | ICD-10-CM | POA: Insufficient documentation

## 2022-05-17 DIAGNOSIS — M25641 Stiffness of right hand, not elsewhere classified: Secondary | ICD-10-CM

## 2022-05-17 NOTE — Therapy (Signed)
[facility] [address] [place], [address] Phone: [phone]   Fax:  [phone]  Occupational Therapy Evaluation  Patient Details  Name: Melissa Preston MRN: [id number] Date of Birth: 12/31/1931 Referring Provider (OT): Fate [place]   Encounter Date: 05/17/2022   OT End of Session - 05/17/22 1252     Visit Number 1    Number of Visits 12    Date for OT Re-Evaluation 06/28/22    OT Start Time 1045    OT Stop Time 1143    OT Time Calculation (min) 58 min    Activity Tolerance Patient tolerated treatment well    Behavior During Therapy [facility] for tasks assessed/performed             Past Medical History:  Diagnosis Date   Arthritis    neck   Asthma    Cancer ([place])    SKIN CA   Depression    Headache    migraines, sinus   Hypothyroidism    Vertigo     Past Surgical History:  Procedure Laterality Date   ABDOMINAL HYSTERECTOMY     CATARACT EXTRACTION W/PHACO Right 08/26/2016   Procedure: CATARACT EXTRACTION PHACO AND INTRAOCULAR LENS PLACEMENT ([place]);  Surgeon: Ronnell Freshwater, MD;  Location: [place];  Service: Ophthalmology;  Laterality: Right;  right   CATARACT EXTRACTION W/PHACO Left 09/23/2016   Procedure: CATARACT EXTRACTION PHACO AND INTRAOCULAR LENS PLACEMENT ([place])  left;  Surgeon: Ronnell Freshwater, MD;  Location: [place];  Service: Ophthalmology;  Laterality: Left;   COLONOSCOPY     TONSILLECTOMY      There were no vitals filed for this visit.   Subjective Assessment - 05/17/22 1239     Subjective  I still wearing this soft thumb brace they told me to keep on.  Just take it off with exercises.  Only using my hand about 25%.  Can lift or hold anything it hurts.  Thumb the worst and stiff as well as my wrist.    Pertinent History Melissa Preston is a 88 y.o. female that 05/09/22 at [facility] clinic for follow up evaluation and management of right distal  right distal radius fracture sustained 02/28/2022 . Last seen 04/18/2022 at which time she was kept in a thumb spica cast due to lingering anatomic snuffbox pain. Xray neg for scaphoid fx but do have [place] artthritis - refer to OT    Patient Stated Goals I would like to be able to use my hand like before I fell in bathing and dressing and using it around the house-and to be able to bowl again    Currently in Pain? Yes    Pain Score 6     Pain Location Wrist   Thumb   Pain Orientation Right    Pain Descriptors / Indicators Aching;Tightness;Sore    Pain Type Acute pain    Pain Onset More than a month ago    Pain Frequency Intermittent               [facility] OT Assessment - 05/17/22 0001       Assessment   Medical Diagnosis Right closed distal radius fracture? [facility] OA    Referring Provider (OT) Tamala Julian PA    Onset Date/Surgical Date 03/01/22    Hand Dominance Right      Precautions   Required Braces or Orthoses --   prefab thumb spica     Home  Environment  Lives With Alone      Prior Function   Vocation Retired      AROM   Right Wrist Extension 44 Degrees    Right Wrist Flexion 35 Degrees    Right Wrist Radial Deviation 18 Degrees    Right Wrist Ulnar Deviation 20 Degrees    Left Wrist Extension 70 Degrees    Left Wrist Flexion 80 Degrees    Left Wrist Radial Deviation 18 Degrees    Left Wrist Ulnar Deviation 24 Degrees      Strength   Right Hand Grip (lbs) 15    Right Hand Lateral Pinch 4 lbs    Right Hand 3 Point Pinch 2 lbs   pain thumb   Left Hand Grip (lbs) 35    Left Hand Lateral Pinch 7 lbs    Left Hand 3 Point Pinch 6 lbs      Right Hand AROM   R Thumb MCP 0-60 10 Degrees    R Thumb IP 0-80 40 Degrees    R Thumb Radial ABduction/ADduction 0-55 40    R Thumb Palmar ABduction/ADduction 0-45 45    R Thumb Opposition to Index --   pain and no IP flexion to 4th and 5th                     OT Treatments/Exercises (OP) - 05/17/22 0001       RUE  Paraffin   Number Minutes Paraffin 8 Minutes    RUE Paraffin Location Hand    Comments prior to soft tissue and ROM               Done soft tissue massage to right webspace as well as metacarpal spreads to gentle prior to review of home program. Patient to do moist heat 3 times a day prior to some soft tissue massage to webspace.  Patient when sitting watching TV tried to rest lakhs hand with thumb out of palm. Home exercises 10 reps thumb palmar abduction and radial abduction Passive range of motion gentle to thumb IP and MC flexion prior to opposition picking up 2 cm foam block.  Keeping pain under 09/1008 reps AROM for wrist flexion extension as well as radial ulnar deviation 10-12 reps. 3 times a day. Continue when out and about and using hand to use thumb splint.       OT Education - 05/17/22 1252     Education Details Findings of evaluation and home program    Person(s) Educated Patient    Methods Explanation;Demonstration;Tactile cues;Verbal cues;Handout    Comprehension Verbal cues required;Returned demonstration;Verbalized understanding              OT Short Term Goals - 05/17/22 1257       OT SHORT TERM GOAL #1   Title Patient to be independent in home program to show increase range of motion in R thumb and wrist with keeping pain under 3/10    Baseline Pain can increase to-8/10 with use or range of motion out of splint.  Patient no knowledge of home program.  Range of motion    Time 3    Period Weeks    Status New    Target Date 06/07/22               OT Long Term Goals - 05/17/22 1258       OT LONG TERM GOAL #1   Title Right thumb active range of motion for flexion and opposition improved  for patient to be able to initiate use of hand and eating, brushing teeth as well as ADLs more than 50%.    Baseline THumb into ADD - 10 MC flexion, 40 IP flexion and opposition stiff and pain to 4th and 5th - no IP flexion with opposition -only use hand 25%     Time 4    Period Weeks    Status New    Target Date 06/14/22      OT LONG TERM GOAL #2   Title Right wrist flexion extension improved to within functional limits for patient to use hand more than 75% in bathing and dressing increase symptoms    Baseline Wrist flexion 35, extention 44 degrees-- using hand 25% - thumb spica soft most of ime on    Time 4    Period Weeks    Status New    Target Date 06/14/22      OT LONG TERM GOAL #3   Title Thumb palmar/ radial abduction improved within functional limits for patient to use it and turning a doorknob and holding a half a glass with pain less than 2/10    Baseline decrease thumb PA and RA - 45 and 40 degrees - using hand 25 % and keeping thumb splint on most of ime    Time 5    Period Weeks    Status New    Target Date 06/21/22      OT LONG TERM GOAL #4   Title Right grip and prehension strength improve within functional limits for her age with pain less than or 2/10 for patient to return to more than 75% of using hand    Baseline R grip 15, lat 4 and 3 point 2 lbs - L grip 35, Lat 7 and 3 point 6 lbs - using hand 25% or less -an pain 2-8/10    Time 6    Period Weeks    Status New    Target Date 06/28/22                   Plan - 05/17/22 1252     Clinical Impression Statement Patient present to OT evaluation 11 weeks out from right closed distal radius fracture on 03/01/2022.  Patient was immobilized for about 10 weeks secondary to lingering thumb pain.  Patient was in thumb spica splint.  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  Negative x-ray for scaphoid fracture.  Patient present this date with stiffness of wrist flexion extension as well as severe stiffness of thumb in all joints and planes.  Thumb CMC into abduction into palm.  Patient report pain 2-8/10 depending on activity and use in right thumb.  Patient is right-hand dominant.  Patient also with decreased strength in the wrist and grip and prehension.  Patient limited in  functional use of right dominant hand in ADLs and IADLs.  Patient can benefit from skilled OT services to increase motion and strength without increasing pain to return to prior level of function.    OT Occupational Profile and History Problem Focused Assessment - Including review of records relating to presenting problem    Occupational performance deficits (Please refer to evaluation for details): ADL's;IADL's;Play;Leisure;Social Participation    Body Structure / Function / Physical Skills ADL;FMC;Flexibility;IADL;ROM;UE functional use;Pain;Dexterity;Decreased knowledge of use of DME;Strength    Rehab Potential Good    Clinical Decision Making Limited treatment options, no task modification necessary    Comorbidities Affecting Occupational Performance: None   Arthritis  of thumb thumb CMC/immobilized for 10 weeks   Modification or Assistance to Complete Evaluation  Min-Moderate modification of tasks or assist with assess necessary to complete eval    OT Frequency 2x / week    OT Duration 6 weeks    OT Treatment/Interventions Self-care/ADL training;Paraffin;Moist Heat;Fluidtherapy;Therapeutic exercise;Manual Therapy;Patient/family education;Passive range of motion;Therapeutic activities;DME and/or AE instruction;Splinting    Consulted and Agree with Plan of Care Patient             Patient will benefit from skilled therapeutic intervention in order to improve the following deficits and impairments:   Body Structure / Function / Physical Skills: ADL, FMC, Flexibility, IADL, ROM, UE functional use, Pain, Dexterity, Decreased knowledge of use of DME, Strength       Visit Diagnosis: Stiffness of right wrist, not elsewhere classified  Stiffness of right hand, not elsewhere classified  Pain in right hand  Muscle weakness (generalized)    Problem List There are no problems to display for this patient.   Rosalyn Gess, OTR/L,CLT 05/17/2022, 1:04 PM  [facility] [address] [place], [address] Phone: [phone]   Fax:  [phone]  Name: Jenet Durio MRN: [id number] Date of Birth: Jul 23, 1932

## 2022-05-20 ENCOUNTER — Ambulatory Visit: Payer: Medicare Other | Attending: Orthopedic Surgery | Admitting: Occupational Therapy

## 2022-05-20 DIAGNOSIS — M79641 Pain in right hand: Secondary | ICD-10-CM | POA: Insufficient documentation

## 2022-05-20 DIAGNOSIS — M25641 Stiffness of right hand, not elsewhere classified: Secondary | ICD-10-CM | POA: Insufficient documentation

## 2022-05-20 DIAGNOSIS — M6281 Muscle weakness (generalized): Secondary | ICD-10-CM | POA: Diagnosis present

## 2022-05-20 DIAGNOSIS — M25631 Stiffness of right wrist, not elsewhere classified: Secondary | ICD-10-CM | POA: Diagnosis present

## 2022-05-20 NOTE — Therapy (Signed)
[place] [facility] [address] [place], [address] Phone: [phone]   Fax:  [phone]  Occupational Therapy Treatment  Patient Details  Name: Chanee Henrickson MRN: [id number] Date of Birth: 1932/03/11 Referring Provider (OT): [place]   Encounter Date: 05/20/2022   OT End of Session - 05/20/22 1158     Visit Number 2    Number of Visits 12    Date for OT Re-Evaluation 06/28/22    OT Start Time 1200    OT Stop Time 1243    OT Time Calculation (min) 43 min    Activity Tolerance Patient tolerated treatment well    Behavior During Therapy [facility] for tasks assessed/performed             Past Medical History:  Diagnosis Date   Arthritis    neck   Asthma    Cancer ([place])    SKIN CA   Depression    Headache    migraines, sinus   Hypothyroidism    Vertigo     Past Surgical History:  Procedure Laterality Date   ABDOMINAL HYSTERECTOMY     CATARACT EXTRACTION W/PHACO Right 08/26/2016   Procedure: CATARACT EXTRACTION PHACO AND INTRAOCULAR LENS PLACEMENT ([place]);  Surgeon: Ronnell Freshwater, MD;  Location: [place];  Service: Ophthalmology;  Laterality: Right;  right   CATARACT EXTRACTION W/PHACO Left 09/23/2016   Procedure: CATARACT EXTRACTION PHACO AND INTRAOCULAR LENS PLACEMENT ([place])  left;  Surgeon: Ronnell Freshwater, MD;  Location: [place];  Service: Ophthalmology;  Laterality: Left;   COLONOSCOPY     TONSILLECTOMY      There were no vitals filed for this visit.   Subjective Assessment - 05/20/22 1158     Subjective  Less pain and can tell I have more motion -and feel like little more grip strength - thumb little out of the palm    Pertinent History Amilah Greenspan is a 88 y.o. female that 05/09/22 at [facility] clinic for follow up evaluation and management of right distal right distal radius fracture sustained 02/28/2022 . Last seen 04/18/2022 at which time she was kept in  a thumb spica cast due to lingering anatomic snuffbox pain. Xray neg for scaphoid fx but do have [place] artthritis - refer to OT    Patient Stated Goals I would like to be able to use my hand like before I fell in bathing and dressing and using it around the house-and to be able to bowl again    Currently in Pain? No/denies                [facility] OT Assessment - 05/20/22 0001       AROM   Right Wrist Extension 50 Degrees    Right Wrist Flexion 60 Degrees    Right Wrist Radial Deviation 28 Degrees    Right Wrist Ulnar Deviation 24 Degrees      Strength   Right Hand Grip (lbs) 24    Right Hand Lateral Pinch 4 lbs    Right Hand 3 Point Pinch 3 lbs      Right Hand AROM   R Thumb MCP 0-60 20 Degrees    R Thumb IP 0-80 55 Degrees    R Thumb Radial ABduction/ADduction 0-55 45    R Thumb Palmar ABduction/ADduction 0-45 45                Pt with increase AROM in thumb and wrist -  in all planes - increase grip - but prehension about the same       OT Treatments/Exercises (OP) - 05/20/22 0001       RUE Paraffin   Number Minutes Paraffin 8 Minutes    RUE Paraffin Location Hand    Comments prior to soft tissue and ROM            Done soft tissue massage to right webspace as well as metacarpal spreads  gentle prior to review of home program. Patient to do moist heat 2-3 times a day prior to some soft tissue massage to webspace.  Patient when sitting watching TV tried to relax hand with thumb out of palm. Home exercises 10 reps thumb palmar abduction and radial abduction Passive range of motion gentle to thumb IP and MC flexion prior to opposition picking up 2 cm foam block. Needed min A for making oval and opening thumb into RA - inbetween   Keeping pain under 2/10 - 10 reps  Upgrade to [facility] for wrist flexion /extension as well as radial /ulnar deviation 10-12 reps. 3 times a day.needed min A  Continue when out and about  thumb splint.        OT Education -  05/20/22 1158     Education Details progress and HEP changes    Person(s) Educated Patient    Methods Explanation;Demonstration;Tactile cues;Verbal cues;Handout    Comprehension Verbal cues required;Returned demonstration;Verbalized understanding              OT Short Term Goals - 05/17/22 1257       OT SHORT TERM GOAL #1   Title Patient to be independent in home program to show increase range of motion in R thumb and wrist with keeping pain under 3/10    Baseline Pain can increase to-8/10 with use or range of motion out of splint.  Patient no knowledge of home program.  Range of motion    Time 3    Period Weeks    Status New    Target Date 06/07/22               OT Long Term Goals - 05/17/22 1258       OT LONG TERM GOAL #1   Title Right thumb active range of motion for flexion and opposition improved for patient to be able to initiate use of hand and eating, brushing teeth as well as ADLs more than 50%.    Baseline THumb into ADD - 10 MC flexion, 40 IP flexion and opposition stiff and pain to 4th and 5th - no IP flexion with opposition -only use hand 25%    Time 4    Period Weeks    Status New    Target Date 06/14/22      OT LONG TERM GOAL #2   Title Right wrist flexion extension improved to within functional limits for patient to use hand more than 75% in bathing and dressing increase symptoms    Baseline Wrist flexion 35, extention 44 degrees-- using hand 25% - thumb spica soft most of ime on    Time 4    Period Weeks    Status New    Target Date 06/14/22      OT LONG TERM GOAL #3   Title Thumb palmar/ radial abduction improved within functional limits for patient to use it and turning a doorknob and holding a half a glass with pain less than 2/10    Baseline decrease thumb PA  and RA - 45 and 40 degrees - using hand 25 % and keeping thumb splint on most of ime    Time 5    Period Weeks    Status New    Target Date 06/21/22      OT LONG TERM GOAL #4    Title Right grip and prehension strength improve within functional limits for her age with pain less than or 2/10 for patient to return to more than 75% of using hand    Baseline R grip 15, lat 4 and 3 point 2 lbs - L grip 35, Lat 7 and 3 point 6 lbs - using hand 25% or less -an pain 2-8/10    Time 6    Period Weeks    Status New    Target Date 06/28/22                   Plan - 05/20/22 1158     Clinical Impression Statement Patient present to OT evaluation 11  1/2 weeks out from right closed distal radius fracture on 03/01/2022.  Patient was immobilized for about 10 weeks secondary to lingering thumb pain.  Patient was in thumb spica splint.  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  Negative x-ray for scaphoid fracture.  Patient present at eval  with stiffness of wrist flexion/ extension as well as severe stiffness of thumb in all joints and planes.  Thumb CMC into abduction into palm.  Patient report pain 2-8/10 depending on activity and use in right thumb.  Patient is right-hand dominant. Pt arrive for 1st follow up with increase improvement in AROM for wrist and thumb - with reported increase use of R hand. Pain 0.10 coming in.   Patient also with decreased strength in the wrist and grip and prehension at eval but increase grip this date.  Patient limited in functional use of right dominant hand in ADLs and IADLs.  Patient can benefit from skilled OT services to increase motion and strength without increasing pain to return to prior level of function.    OT Occupational Profile and History Problem Focused Assessment - Including review of records relating to presenting problem    Occupational performance deficits (Please refer to evaluation for details): ADL's;IADL's;Play;Leisure;Social Participation    Body Structure / Function / Physical Skills ADL;FMC;Flexibility;IADL;ROM;UE functional use;Pain;Dexterity;Decreased knowledge of use of DME;Strength    Rehab Potential Good    Clinical Decision  Making Limited treatment options, no task modification necessary    Comorbidities Affecting Occupational Performance: None    Modification or Assistance to Complete Evaluation  Min-Moderate modification of tasks or assist with assess necessary to complete eval    OT Frequency 2x / week    OT Duration 6 weeks    OT Treatment/Interventions Self-care/ADL training;Paraffin;Moist Heat;Fluidtherapy;Therapeutic exercise;Manual Therapy;Patient/family education;Passive range of motion;Therapeutic activities;DME and/or AE instruction;Splinting    Consulted and Agree with Plan of Care Patient             Patient will benefit from skilled therapeutic intervention in order to improve the following deficits and impairments:   Body Structure / Function / Physical Skills: ADL, FMC, Flexibility, IADL, ROM, UE functional use, Pain, Dexterity, Decreased knowledge of use of DME, Strength       Visit Diagnosis: Stiffness of right wrist, not elsewhere classified  Stiffness of right hand, not elsewhere classified  Pain in right hand  Muscle weakness (generalized)    Problem List There are no problems to display for this patient.  Rosalyn Gess, OTR/L;CLT  05/20/2022, 12:45 PM  [place] [facility] [address] [place], [address] Phone: [phone]   Fax:  [phone]  Name: Kameran Lallier MRN: [id number] Date of Birth: 13-May-1932

## 2022-05-22 ENCOUNTER — Ambulatory Visit: Payer: Medicare Other | Admitting: Occupational Therapy

## 2022-05-22 DIAGNOSIS — M25631 Stiffness of right wrist, not elsewhere classified: Secondary | ICD-10-CM

## 2022-05-22 DIAGNOSIS — M79641 Pain in right hand: Secondary | ICD-10-CM

## 2022-05-22 DIAGNOSIS — M6281 Muscle weakness (generalized): Secondary | ICD-10-CM

## 2022-05-22 DIAGNOSIS — M25641 Stiffness of right hand, not elsewhere classified: Secondary | ICD-10-CM

## 2022-05-22 NOTE — Therapy (Signed)
[place] [facility] [address] [place], [address] Phone: [phone]   Fax:  [phone]  Occupational Therapy Treatment  Patient Details  Name: Melissa Preston MRN: [id number] Date of Birth: 20-Feb-1932 Referring Provider (OT): [place]   Encounter Date: 05/22/2022   OT End of Session - 05/22/22 1732     Visit Number 3    Number of Visits 12    Date for OT Re-Evaluation 06/28/22    OT Start Time 2297    OT Stop Time 1617    OT Time Calculation (min) 39 min    Activity Tolerance Patient tolerated treatment well    Behavior During Therapy [facility] for tasks assessed/performed             Past Medical History:  Diagnosis Date   Arthritis    neck   Asthma    Cancer ([place])    SKIN CA   Depression    Headache    migraines, sinus   Hypothyroidism    Vertigo     Past Surgical History:  Procedure Laterality Date   ABDOMINAL HYSTERECTOMY     CATARACT EXTRACTION W/PHACO Right 08/26/2016   Procedure: CATARACT EXTRACTION PHACO AND INTRAOCULAR LENS PLACEMENT ([place]);  Surgeon: Ronnell Freshwater, MD;  Location: [place];  Service: Ophthalmology;  Laterality: Right;  right   CATARACT EXTRACTION W/PHACO Left 09/23/2016   Procedure: CATARACT EXTRACTION PHACO AND INTRAOCULAR LENS PLACEMENT ([place])  left;  Surgeon: Ronnell Freshwater, MD;  Location: [place];  Service: Ophthalmology;  Laterality: Left;   COLONOSCOPY     TONSILLECTOMY      There were no vitals filed for this visit.   Subjective Assessment - 05/22/22 1731     Subjective  I can tell just since last Friday that I have more strength more motion and increased use of my hand.  I am using it more.  Was able to do my buttons this morning.  Did not use my brace really at all except driving over here and heavy activity.    Pertinent History Melissa Preston is a 88 y.o. female that 05/09/22 at [facility] clinic for follow up evaluation  and management of right distal right distal radius fracture sustained 02/28/2022 . Last seen 04/18/2022 at which time she was kept in a thumb spica cast due to lingering anatomic snuffbox pain. Xray neg for scaphoid fx but do have [place] artthritis - refer to OT    Patient Stated Goals I would like to be able to use my hand like before I fell in bathing and dressing and using it around the house-and to be able to bowl again    Currently in Pain? Yes    Pain Score 2     Pain Location --   thumb   Pain Orientation Right    Pain Descriptors / Indicators Aching;Tightness;Tender    Pain Type Chronic pain    Pain Onset More than a month ago    Pain Frequency Intermittent                [facility] OT Assessment - 05/22/22 0001       AROM   Right Wrist Extension 53 Degrees    Right Wrist Flexion 75 Degrees      Strength   Right Hand Grip (lbs) 26              Pt cont to show increase AROM and functional ue of  R hand and wrist - with decrease pain  Pain mostly in thumb MC and CMC         OT Treatments/Exercises (OP) - 05/22/22 0001       RUE Paraffin   Number Minutes Paraffin 8 Minutes    RUE Paraffin Location Hand    Comments Prior to soft tissue and range of motion            Done soft tissue massage to right webspace as well as metacarpal spreads  gentle prior to review of home program. Patient to  cont to do moist heat 2-3 times a day prior to some soft tissue massage to webspace.  Patient when sitting watching TV tried to relax hand with thumb out of palm.  Home exercises 10 reps thumb palmar abduction and radial abduction Add and done rubberband for Thumb PA and RA 12 reps 2 x day  Passive range of motion gentle to thumb IP and MC flexion prior to opposition picking up 2 cm foam block. Needed min A for making oval and opening thumb into RA - inbetween   Keeping pain under 2/10 - 10 reps  Cont AAROM for wrist flexion /extension as well as radial /ulnar deviation  10-12 reps. 3 times a day.needed min A  And add and done 1 lbs for wrist in all planes - 12 reps pain free  Can increase to 2nd set in 3 days if pain free Did add light table slides on towel for wrist extention 20 reps pain free- slight pul Can cont to wean gradually out of thumb splint but on with painful activities          OT Education - 05/22/22 1732     Education Details progress and HEP changes    Person(s) Educated Patient    Methods Explanation;Demonstration;Tactile cues;Verbal cues;Handout    Comprehension Verbal cues required;Returned demonstration;Verbalized understanding              OT Short Term Goals - 05/17/22 1257       OT SHORT TERM GOAL #1   Title Patient to be independent in home program to show increase range of motion in R thumb and wrist with keeping pain under 3/10    Baseline Pain can increase to-8/10 with use or range of motion out of splint.  Patient no knowledge of home program.  Range of motion    Time 3    Period Weeks    Status New    Target Date 06/07/22               OT Long Term Goals - 05/17/22 1258       OT LONG TERM GOAL #1   Title Right thumb active range of motion for flexion and opposition improved for patient to be able to initiate use of hand and eating, brushing teeth as well as ADLs more than 50%.    Baseline THumb into ADD - 10 MC flexion, 40 IP flexion and opposition stiff and pain to 4th and 5th - no IP flexion with opposition -only use hand 25%    Time 4    Period Weeks    Status New    Target Date 06/14/22      OT LONG TERM GOAL #2   Title Right wrist flexion extension improved to within functional limits for patient to use hand more than 75% in bathing and dressing increase symptoms    Baseline Wrist flexion 35, extention 44 degrees-- using hand  25% - thumb spica soft most of ime on    Time 4    Period Weeks    Status New    Target Date 06/14/22      OT LONG TERM GOAL #3   Title Thumb palmar/ radial  abduction improved within functional limits for patient to use it and turning a doorknob and holding a half a glass with pain less than 2/10    Baseline decrease thumb PA and RA - 45 and 40 degrees - using hand 25 % and keeping thumb splint on most of ime    Time 5    Period Weeks    Status New    Target Date 06/21/22      OT LONG TERM GOAL #4   Title Right grip and prehension strength improve within functional limits for her age with pain less than or 2/10 for patient to return to more than 75% of using hand    Baseline R grip 15, lat 4 and 3 point 2 lbs - L grip 35, Lat 7 and 3 point 6 lbs - using hand 25% or less -an pain 2-8/10    Time 6    Period Weeks    Status New    Target Date 06/28/22                   Plan - 05/22/22 1733     Clinical Impression Statement Patient had right closed distal radius fracture on 03/01/2022.  Patient was immobilized for about 10 weeks secondary to lingering thumb pain.  Patient was in thumb spica splint.  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  Negative x-ray for scaphoid fracture.  Patient present at eval  with stiffness of wrist flexion/ extension as well as severe stiffness of thumb in all joints and planes.  Thumb CMC into abduction into palm.  Patient report pain 2-8/10 depending on activity and use in right thumb.  Patient is right-hand dominant.Pt made great progress in 2 session in AROM at R thumb and wrist - as well as pain- show increase grip and can wean out of thumb splint more during day. Wearing about 25% of time - was able to initiate 1 lbs weight for wrist and strengthening for thumb PA and RA- Pain 0/10 coming in.   Patient cont to show decreased strength in the wrist and grip and prehension and decrease thumb RA and prehension more than grip.at eval but increase grip this date.  Patient limited in functional use of right dominant hand in ADLs and IADLs.  Patient can benefit  from skilled OT services to increase motion and strength without  increasing pain to return to prior level of function.    OT Occupational Profile and History Problem Focused Assessment - Including review of records relating to presenting problem    Occupational performance deficits (Please refer to evaluation for details): ADL's;IADL's;Play;Leisure;Social Participation    Body Structure / Function / Physical Skills ADL;FMC;Flexibility;IADL;ROM;UE functional use;Pain;Dexterity;Decreased knowledge of use of DME;Strength    Rehab Potential Good    Clinical Decision Making Limited treatment options, no task modification necessary    Comorbidities Affecting Occupational Performance: None    Modification or Assistance to Complete Evaluation  Min-Moderate modification of tasks or assist with assess necessary to complete eval    OT Frequency 2x / week    OT Duration 6 weeks    OT Treatment/Interventions Self-care/ADL training;Paraffin;Moist Heat;Fluidtherapy;Therapeutic exercise;Manual Therapy;Patient/family education;Passive range of motion;Therapeutic activities;DME and/or AE instruction;Splinting  Consulted and Agree with Plan of Care Patient             Patient will benefit from skilled therapeutic intervention in order to improve the following deficits and impairments:   Body Structure / Function / Physical Skills: ADL, FMC, Flexibility, IADL, ROM, UE functional use, Pain, Dexterity, Decreased knowledge of use of DME, Strength       Visit Diagnosis: Stiffness of right wrist, not elsewhere classified  Stiffness of right hand, not elsewhere classified  Pain in right hand  Muscle weakness (generalized)    Problem List There are no problems to display for this patient.   Rosalyn Gess, OTR/L,CLT 05/22/2022, 8:01 PM  [place] [facility] [address], [place], [address] Phone: [phone]   Fax:  [phone]  Name: Melissa Preston MRN: [id number] Date of Birth:  Jul 15, 1932

## 2022-05-27 ENCOUNTER — Ambulatory Visit: Payer: Medicare Other | Admitting: Occupational Therapy

## 2022-05-27 DIAGNOSIS — M79641 Pain in right hand: Secondary | ICD-10-CM

## 2022-05-27 DIAGNOSIS — M6281 Muscle weakness (generalized): Secondary | ICD-10-CM

## 2022-05-27 DIAGNOSIS — M25631 Stiffness of right wrist, not elsewhere classified: Secondary | ICD-10-CM | POA: Diagnosis not present

## 2022-05-27 DIAGNOSIS — M25641 Stiffness of right hand, not elsewhere classified: Secondary | ICD-10-CM

## 2022-05-27 NOTE — Therapy (Signed)
[facility] [address] [place], [address] Phone: [phone]   Fax:  [phone]  Occupational Therapy Treatment  Patient Details  Name: Melissa Preston MRN: [id number] Date of Birth: [birth year] Referring Provider (OT): [place]   Encounter Date: 05/27/2022   OT End of Session - 05/27/22 1422     Visit Number 4    Number of Visits 12    Date for OT Re-Evaluation 06/28/22    OT Start Time 1146    OT Stop Time 1222    OT Time Calculation (min) 36 min    Activity Tolerance Patient tolerated treatment well    Behavior During Therapy [facility] for tasks assessed/performed             Past Medical History:  Diagnosis Date   Arthritis    neck   Asthma    Cancer ([place])    SKIN CA   Depression    Headache    migraines, sinus   Hypothyroidism    Vertigo     Past Surgical History:  Procedure Laterality Date   ABDOMINAL HYSTERECTOMY     CATARACT EXTRACTION W/PHACO Right 08/26/2016   Procedure: CATARACT EXTRACTION PHACO AND INTRAOCULAR LENS PLACEMENT ([place]);  Surgeon: Ronnell Freshwater, MD;  Location: [place];  Service: Ophthalmology;  Laterality: Right;  right   CATARACT EXTRACTION W/PHACO Left 09/23/2016   Procedure: CATARACT EXTRACTION PHACO AND INTRAOCULAR LENS PLACEMENT ([place])  left;  Surgeon: Ronnell Freshwater, MD;  Location: [place];  Service: Ophthalmology;  Laterality: Left;   COLONOSCOPY     TONSILLECTOMY      There were no vitals filed for this visit.   Subjective Assessment - 05/27/22 1421     Subjective  I am uis g it more - about 75% but still wearing the thumb splint 25% of the day - pain better and more strength - but not pushing up on my hand yet    Pertinent History Melissa Preston is a 88 y.o. female that 05/09/22 at [facility] clinic for follow up evaluation and management of right distal right distal radius fracture sustained 02/28/2022 . Last seen  04/18/2022 at which time she was kept in a thumb spica cast due to lingering anatomic snuffbox pain. Xray neg for scaphoid fx but do have [place] artthritis - refer to OT    Patient Stated Goals I would like to be able to use my hand like before I fell in bathing and dressing and using it around the house-and to be able to bowl again    Currently in Pain? No/denies                [facility] OT Assessment - 05/27/22 0001       AROM   Right Wrist Extension 63 Degrees    Right Wrist Flexion 75 Degrees      Strength   Right Hand Grip (lbs) 26    Right Hand Lateral Pinch 5 lbs    Right Hand 3 Point Pinch 3 lbs    Left Hand Grip (lbs) 35    Left Hand Lateral Pinch 7 lbs    Left Hand 3 Point Pinch 6 lbs               Making good progress in wrist AROM - able to initiate pushing up with R hand -but 30% compare to L 70% - 5 reps pain free  Can try at home  OT Treatments/Exercises (OP) - 05/27/22 0001       RUE Paraffin   Number Minutes Paraffin 8 Minutes    RUE Paraffin Location Hand    Comments prior to ROM and soft tissue               Done soft tissue massage to right webspace as well as metacarpal spreads  gentle prior to review of home program. Patient to  cont to do moist heat 2-3 times a day prior to some soft tissue massage to webspace.  Patient when sitting watching TV tried to relax hand with thumb out of palm.   Home exercises 10 reps thumb palmar abduction and radial abduction Cont with rubberband for Thumb PA and RA 12 reps 2 x day   Passive range of motion gentle to thumb IP and MC flexion prior to opposition picking up 2 cm foam block. Needed min A for making oval and opening thumb into RA - inbetween   Keeping pain under 2/10 - 10 reps   Cont AAROM for wrist flexion /extension as well as radial /ulnar deviation 10-12 reps. 3 times a day.needed min A  Upgrade to 2 lbs for wrist in all planes - 12 reps pain free  Can increase to 2nd set in 3 days  if pain free and then 3 rd set in 6 days- Reviewed  table slides on towel for wrist extention 20 reps pain free- slight pull- prior to 2 lbs weight EP  Can cont to wean gradually out of thumb splint but on with painful activities       OT Education - 05/27/22 1422     Education Details progress and HEP changes    Person(s) Educated Patient    Methods Explanation;Demonstration;Tactile cues;Verbal cues;Handout    Comprehension Verbal cues required;Returned demonstration;Verbalized understanding              OT Short Term Goals - 05/17/22 1257       OT SHORT TERM GOAL #1   Title Patient to be independent in home program to show increase range of motion in R thumb and wrist with keeping pain under 3/10    Baseline Pain can increase to-8/10 with use or range of motion out of splint.  Patient no knowledge of home program.  Range of motion    Time 3    Period Weeks    Status New    Target Date 06/07/22               OT Long Term Goals - 05/17/22 1258       OT LONG TERM GOAL #1   Title Right thumb active range of motion for flexion and opposition improved for patient to be able to initiate use of hand and eating, brushing teeth as well as ADLs more than 50%.    Baseline THumb into ADD - 10 MC flexion, 40 IP flexion and opposition stiff and pain to 4th and 5th - no IP flexion with opposition -only use hand 25%    Time 4    Period Weeks    Status New    Target Date 06/14/22      OT LONG TERM GOAL #2   Title Right wrist flexion extension improved to within functional limits for patient to use hand more than 75% in bathing and dressing increase symptoms    Baseline Wrist flexion 35, extention 44 degrees-- using hand 25% - thumb spica soft most of ime on  Time 4    Period Weeks    Status New    Target Date 06/14/22      OT LONG TERM GOAL #3   Title Thumb palmar/ radial abduction improved within functional limits for patient to use it and turning a doorknob and holding  a half a glass with pain less than 2/10    Baseline decrease thumb PA and RA - 45 and 40 degrees - using hand 25 % and keeping thumb splint on most of ime    Time 5    Period Weeks    Status New    Target Date 06/21/22      OT LONG TERM GOAL #4   Title Right grip and prehension strength improve within functional limits for her age with pain less than or 2/10 for patient to return to more than 75% of using hand    Baseline R grip 15, lat 4 and 3 point 2 lbs - L grip 35, Lat 7 and 3 point 6 lbs - using hand 25% or less -an pain 2-8/10    Time 6    Period Weeks    Status New    Target Date 06/28/22                   Plan - 05/27/22 1423     Clinical Impression Statement Patient had right closed distal radius fracture on 03/01/2022.  Patient was immobilized for about 11 weeks secondary to lingering thumb pain.  Patient was in thumb spica splint.  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  Negative x-ray for scaphoid fracture.  Patient present at eval  with stiffness of wrist flexion/ extension as well as severe stiffness of thumb in all joints and planes.  Thumb CMC into abduction into palm.  Patient report pain 2-8/10 depending on activity and use in right thumb.  Patient is right-hand dominant. Pt made great progress from [facility] in pain as well as wrist and digits AROM and strength - Grip and prehension increase for pt to wean to about 25% of time splint on - was able to upgrade to 2 lbs weight for wrist and strengthening for thumb PA and RA- Pain 0/10 coming in.   Patient cont to show increase wrist AROM and able to push up with R hand this date but 30% R , L 70 % - but painfree- cont to show  decreased strength in the wrist and grip and prehension and thumb ADD in palm during opposition.  Patient limited in functional use of right dominant hand in ADLs and IADLs.  Patient can benefit  from skilled OT services to increase motion and strength without increasing pain to return to prior level of  function.    OT Occupational Profile and History Problem Focused Assessment - Including review of records relating to presenting problem    Occupational performance deficits (Please refer to evaluation for details): ADL's;IADL's;Play;Leisure;Social Participation    Body Structure / Function / Physical Skills ADL;FMC;Flexibility;IADL;ROM;UE functional use;Pain;Dexterity;Decreased knowledge of use of DME;Strength    Rehab Potential Good    Clinical Decision Making Limited treatment options, no task modification necessary    Comorbidities Affecting Occupational Performance: None    Modification or Assistance to Complete Evaluation  Min-Moderate modification of tasks or assist with assess necessary to complete eval    OT Frequency 2x / week    OT Duration 6 weeks    OT Treatment/Interventions Self-care/ADL training;Paraffin;Moist Heat;Fluidtherapy;Therapeutic exercise;Manual Therapy;Patient/family education;Passive range of motion;Therapeutic activities;DME  and/or AE instruction;Splinting    Consulted and Agree with Plan of Care Patient             Patient will benefit from skilled therapeutic intervention in order to improve the following deficits and impairments:   Body Structure / Function / Physical Skills: ADL, FMC, Flexibility, IADL, ROM, UE functional use, Pain, Dexterity, Decreased knowledge of use of DME, Strength       Visit Diagnosis: Stiffness of right wrist, not elsewhere classified  Stiffness of right hand, not elsewhere classified  Pain in right hand  Muscle weakness (generalized)    Problem List There are no problems to display for this patient.   Rosalyn Gess, OTR/L,CLT 05/27/2022, 2:27 PM  [place] [address], [place], [address] Phone: [phone]   Fax:  [phone]  Name: Melissa Preston MRN: [id number] Date of Birth: Feb 21, 1932

## 2022-06-04 ENCOUNTER — Ambulatory Visit: Payer: Medicare Other | Admitting: Occupational Therapy

## 2022-06-04 DIAGNOSIS — M25631 Stiffness of right wrist, not elsewhere classified: Secondary | ICD-10-CM | POA: Diagnosis not present

## 2022-06-04 DIAGNOSIS — M25641 Stiffness of right hand, not elsewhere classified: Secondary | ICD-10-CM

## 2022-06-04 DIAGNOSIS — M79641 Pain in right hand: Secondary | ICD-10-CM

## 2022-06-04 DIAGNOSIS — M6281 Muscle weakness (generalized): Secondary | ICD-10-CM

## 2022-06-04 NOTE — Therapy (Signed)
[facility] [address], [place], [address] Phone: [phone]   Fax:  [phone]  Occupational Therapy Treatment  Patient Details  Name: Melissa Preston MRN: [id number] Date of Birth: 03-03-1932 Referring Provider (OT): [place]   Encounter Date: 06/04/2022   OT End of Session - 06/04/22 1657     Visit Number 5    Number of Visits 12    Date for OT Re-Evaluation 06/28/22    OT Start Time 1315    OT Stop Time 1406    OT Time Calculation (min) 51 min    Activity Tolerance Patient tolerated treatment well    Behavior During Therapy [facility] for tasks assessed/performed             Past Medical History:  Diagnosis Date   Arthritis    neck   Asthma    Cancer ([place])    SKIN CA   Depression    Headache    migraines, sinus   Hypothyroidism    Vertigo     Past Surgical History:  Procedure Laterality Date   ABDOMINAL HYSTERECTOMY     CATARACT EXTRACTION W/PHACO Right 08/26/2016   Procedure: CATARACT EXTRACTION PHACO AND INTRAOCULAR LENS PLACEMENT ([place]);  Surgeon: Ronnell Freshwater, MD;  Location: [place];  Service: Ophthalmology;  Laterality: Right;  right   CATARACT EXTRACTION W/PHACO Left 09/23/2016   Procedure: CATARACT EXTRACTION PHACO AND INTRAOCULAR LENS PLACEMENT ([place])  left;  Surgeon: Ronnell Freshwater, MD;  Location: [place];  Service: Ophthalmology;  Laterality: Left;   COLONOSCOPY     TONSILLECTOMY      There were no vitals filed for this visit.   Subjective Assessment - 06/04/22 1656     Subjective  I am only wearing that brace when driving.  But I can tell the last 2 weeks that I am getting more strength I can use it more.  To lift things Things hold things, turn doorknobs.  But the content technician yet.  I did bring my bowling ball in for you to see if I can hold    Pertinent History Melissa Preston is a 88 y.o. female that 05/09/22 at [facility]  clinic for follow up evaluation and management of right distal right distal radius fracture sustained 02/28/2022 . Last seen 04/18/2022 at which time she was kept in a thumb spica cast due to lingering anatomic snuffbox pain. Xray neg for scaphoid fx but do have [place] artthritis - refer to OT    Patient Stated Goals I would like to be able to use my hand like before I fell in bathing and dressing and using it around the house-and to be able to bowl again    Currently in Pain? No/denies                [facility] OT Assessment - 06/04/22 0001       Strength   Right Hand Grip (lbs) 29               Prehension strength same than last time. Patient able to push and pull heavy door open but using mostly hand not thumb. Able to push up from chair with no pain 50-50. Able to turn doorknob. Able to carry up to 8 pounds at the side. But white grip using thumb patient can do about 4 pounds but increased pain with 5 and difficulty. Patient was fitted with a CMC neoprene splint to use with  2 and 3 pound weight or painful activities to the thumb.          OT Treatments/Exercises (OP) - 06/04/22 0001       RUE Paraffin   Number Minutes Paraffin 8 Minutes    RUE Paraffin Location Hand    Comments Prior to range of motion and upgrade of home exercises                 Assess patient used with 10 pound bowling ball.  Patient not able to pick it up yet.   Did assess patient's ability to pick up and carry weight but was able only to do 8 pounds at her side cylinder grip but a wider grip could only do 4 pounds.     Home exercises 10 reps thumb palmar abduction and radial abduction Cont with rubberband for Thumb PA and RA 12 reps 2 x day   Passive range of motion gentle to thumb IP and MC flexion prior to opposition picking up 2 cm foam block. Needed min A for making oval and opening thumb into RA - inbetween   Keeping pain under 2/10 - 10 reps    Cont AAROM for wrist flexion  /extension as well as radial /ulnar deviation 10-12 reps. 3 times a day.needed min A  Patient to continue 2 lbs for wrist  and forearm in all planes - 12 reps pain free   2nd set in 3 days if pain free a continue third set Patient was able to do 3 pounds for elbow flexion and extension, scapular squeezes and some slight shoulder [place].  Patient only to do once a day or every other day 12-15 reps pain-free   Patient to continue table slides on towel for wrist extention 20 reps pain free- slight pull- prior to 2 lbs weight HEP             OT Education - 06/04/22 1657     Education Details progress and HEP changes    Person(s) Educated Patient    Methods Explanation;Demonstration;Tactile cues;Verbal cues;Handout    Comprehension Verbal cues required;Returned demonstration;Verbalized understanding              OT Short Term Goals - 05/17/22 1257       OT SHORT TERM GOAL #1   Title Patient to be independent in home program to show increase range of motion in R thumb and wrist with keeping pain under 3/10    Baseline Pain can increase to-8/10 with use or range of motion out of splint.  Patient no knowledge of home program.  Range of motion    Time 3    Period Weeks    Status New    Target Date 06/07/22               OT Long Term Goals - 05/17/22 1258       OT LONG TERM GOAL #1   Title Right thumb active range of motion for flexion and opposition improved for patient to be able to initiate use of hand and eating, brushing teeth as well as ADLs more than 50%.    Baseline THumb into ADD - 10 MC flexion, 40 IP flexion and opposition stiff and pain to 4th and 5th - no IP flexion with opposition -only use hand 25%    Time 4    Period Weeks    Status New    Target Date 06/14/22      OT LONG TERM GOAL #  2   Title Right wrist flexion extension improved to within functional limits for patient to use hand more than 75% in bathing and dressing increase symptoms    Baseline  Wrist flexion 35, extention 44 degrees-- using hand 25% - thumb spica soft most of ime on    Time 4    Period Weeks    Status New    Target Date 06/14/22      OT LONG TERM GOAL #3   Title Thumb palmar/ radial abduction improved within functional limits for patient to use it and turning a doorknob and holding a half a glass with pain less than 2/10    Baseline decrease thumb PA and RA - 45 and 40 degrees - using hand 25 % and keeping thumb splint on most of ime    Time 5    Period Weeks    Status New    Target Date 06/21/22      OT LONG TERM GOAL #4   Title Right grip and prehension strength improve within functional limits for her age with pain less than or 2/10 for patient to return to more than 75% of using hand    Baseline R grip 15, lat 4 and 3 point 2 lbs - L grip 35, Lat 7 and 3 point 6 lbs - using hand 25% or less -an pain 2-8/10    Time 6    Period Weeks    Status New    Target Date 06/28/22                   Plan - 06/04/22 1658     Clinical Impression Statement Patient had right closed distal radius fracture on 03/01/2022.  Patient was immobilized for about 11 weeks secondary to lingering thumb pain.  Patient was in thumb spica splint.  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  Negative x-ray for scaphoid fracture.  Patient present at eval  with stiffness of wrist flexion/ extension as well as severe stiffness of thumb in all joints and planes.  Thumb CMC into abduction into palm.  Patient report pain 2-8/10 depending on activity and use in right thumb.  Patient is right-hand dominant. Pt made great progress from [facility] in pain as well as wrist and digits AROM and strength - Grip strength increasing gradually but not prehension-patient fitted with a neoprene CMC splint to use with 2 and 3 pound weight for wrist and upper extremity strengthening.  As well as activities at home that causes pain at the thumb [facility] more than a 2/10.  Reviewed again 2 lbs weight for wrist and  strengthening for thumb PA and RA- Pain 0/10 coming in.   Patient cont to show increase wrist AROM and able to push up with R hand this date but 30% R , L 70 % - but painfree- cont to show  decreased strength in the wrist/forearm-and rubber band for palmar radial abduction of thumb-patient to increase gradually over the next week to a second and third reps..  Patient limited in functional use of right dominant hand in ADLs and IADLs.  Patient can benefit  from skilled OT services to increase motion and strength without increasing pain to return to prior level of function.    OT Occupational Profile and History Problem Focused Assessment - Including review of records relating to presenting problem    Occupational performance deficits (Please refer to evaluation for details): ADL's;IADL's;Play;Leisure;Social Participation    Body Structure / Function / Physical  Skills ADL;FMC;Flexibility;IADL;ROM;UE functional use;Pain;Dexterity;Decreased knowledge of use of DME;Strength    Rehab Potential Good    Clinical Decision Making Limited treatment options, no task modification necessary    Comorbidities Affecting Occupational Performance: None    Modification or Assistance to Complete Evaluation  Min-Moderate modification of tasks or assist with assess necessary to complete eval    OT Frequency 2x / week    OT Duration 6 weeks    OT Treatment/Interventions Self-care/ADL training;Paraffin;Moist Heat;Fluidtherapy;Therapeutic exercise;Manual Therapy;Patient/family education;Passive range of motion;Therapeutic activities;DME and/or AE instruction;Splinting    Consulted and Agree with Plan of Care Patient             Patient will benefit from skilled therapeutic intervention in order to improve the following deficits and impairments:   Body Structure / Function / Physical Skills: ADL, FMC, Flexibility, IADL, ROM, UE functional use, Pain, Dexterity, Decreased knowledge of use of DME, Strength       Visit  Diagnosis: Stiffness of right wrist, not elsewhere classified  Stiffness of right hand, not elsewhere classified  Pain in right hand  Muscle weakness (generalized)    Problem List There are no problems to display for this patient.   Rosalyn Gess, OTR/L,CLT 06/04/2022, 5:01 PM  [place] [facility] [address] [place], [address] Phone: [phone]   Fax:  [phone]  Name: Darryl Blumenstein MRN: [id number] Date of Birth: 1932/06/25

## 2022-06-06 ENCOUNTER — Encounter: Payer: Medicare Other | Admitting: Occupational Therapy

## 2022-06-13 ENCOUNTER — Ambulatory Visit: Payer: Medicare Other | Admitting: Occupational Therapy

## 2022-06-13 DIAGNOSIS — M6281 Muscle weakness (generalized): Secondary | ICD-10-CM

## 2022-06-13 DIAGNOSIS — M79641 Pain in right hand: Secondary | ICD-10-CM

## 2022-06-13 DIAGNOSIS — M25641 Stiffness of right hand, not elsewhere classified: Secondary | ICD-10-CM

## 2022-06-13 DIAGNOSIS — M25631 Stiffness of right wrist, not elsewhere classified: Secondary | ICD-10-CM

## 2022-06-13 NOTE — Therapy (Signed)
[facility] [address] [place], [address] Phone: [phone]   Fax:  [phone]  Occupational Therapy Treatment  Patient Details  Name: Melissa Preston MRN: [id number] Date of Birth: 1932-06-23 Referring Provider (OT): [place]   Encounter Date: 06/13/2022   OT End of Session - 06/13/22 1831     Visit Number 6    Number of Visits 12    Date for OT Re-Evaluation 06/28/22    OT Start Time 1319    OT Stop Time 1401    OT Time Calculation (min) 42 min    Activity Tolerance Patient tolerated treatment well    Behavior During Therapy [facility] for tasks assessed/performed             Past Medical History:  Diagnosis Date   Arthritis    neck   Asthma    Cancer ([place])    SKIN CA   Depression    Headache    migraines, sinus   Hypothyroidism    Vertigo     Past Surgical History:  Procedure Laterality Date   ABDOMINAL HYSTERECTOMY     CATARACT EXTRACTION W/PHACO Right 08/26/2016   Procedure: CATARACT EXTRACTION PHACO AND INTRAOCULAR LENS PLACEMENT ([place]);  Surgeon: Ronnell Freshwater, MD;  Location: [place];  Service: Ophthalmology;  Laterality: Right;  right   CATARACT EXTRACTION W/PHACO Left 09/23/2016   Procedure: CATARACT EXTRACTION PHACO AND INTRAOCULAR LENS PLACEMENT ([place])  left;  Surgeon: Ronnell Freshwater, MD;  Location: [place];  Service: Ophthalmology;  Laterality: Left;   COLONOSCOPY     TONSILLECTOMY      There were no vitals filed for this visit.   Subjective Assessment - 06/13/22 1318     Subjective  Doing okay -I can tell is much more strength.  I would say I am about 90%.  Still having trouble turning the car key and lifting heavy objects    Pertinent History Melissa Preston is a 88 y.o. female that 05/09/22 at [facility] clinic for follow up evaluation and management of right distal right distal radius fracture sustained 02/28/2022 . Last seen 04/18/2022  at which time she was kept in a thumb spica cast due to lingering anatomic snuffbox pain. Xray neg for scaphoid fx but do have [place] artthritis - refer to OT    Patient Stated Goals I would like to be able to use my hand like before I fell in bathing and dressing and using it around the house-and to be able to bowl again    Currently in Pain? No/denies                  Prehension strength same than last time. Grip did improve some.  Able to push up from chair with no pain 50-50. Able to turn doorknob. Able to carry up to 10 pounds at the side. But  grip using thumb patient can do 4 -5 lbs. Patient was fitted with a CMC neoprene splint  in past to use with 2 and 3 pound weight or painful activities to the thumb.                             OT Treatments/Exercises (OP) - 06/13/22 0001       RUE Paraffin   Number Minutes Paraffin 8 Minutes    RUE Paraffin Location Hand    Comments prior to ROM and strengthening  Home exercises 10 reps thumb palmar abduction and radial abduction Cont with rubberband for Thumb PA and RA 12 reps 2 x day   Passive range of motion gentle to thumb IP and MC flexion prior to opposition picking up 2 cm foam block. Needed min A for making oval and opening thumb into RA - inbetween   Keeping pain under 2/10 - 10 reps    Cont AAROM for wrist flexion /extension as well as radial /ulnar deviation 10-12 reps. 2 times a day prior to strengthening Pt able to do 3 sets of 2 lbs for wrist and forearm in all planes 2 x day  Did add light blue putty for gripping 15 reps  Using CMC neoprene splint for lat and 3 point pinch 15 reps 2 x day  Can increase in week to 2nd set and then 3rd set if pain free    Patient to continue table slides on towel for wrist extention 20 reps pain free- slight pull- prior to 2 lbs weight HEP             OT Education - 06/13/22 1330     Education Details progress and HEP changes     Person(s) Educated Patient    Methods Explanation;Demonstration;Tactile cues;Verbal cues;Handout    Comprehension Verbal cues required;Returned demonstration;Verbalized understanding              OT Short Term Goals - 05/17/22 1257       OT SHORT TERM GOAL #1   Title Patient to be independent in home program to show increase range of motion in R thumb and wrist with keeping pain under 3/10    Baseline Pain can increase to-8/10 with use or range of motion out of splint.  Patient no knowledge of home program.  Range of motion    Time 3    Period Weeks    Status New    Target Date 06/07/22               OT Long Term Goals - 05/17/22 1258       OT LONG TERM GOAL #1   Title Right thumb active range of motion for flexion and opposition improved for patient to be able to initiate use of hand and eating, brushing teeth as well as ADLs more than 50%.    Baseline THumb into ADD - 10 MC flexion, 40 IP flexion and opposition stiff and pain to 4th and 5th - no IP flexion with opposition -only use hand 25%    Time 4    Period Weeks    Status New    Target Date 06/14/22      OT LONG TERM GOAL #2   Title Right wrist flexion extension improved to within functional limits for patient to use hand more than 75% in bathing and dressing increase symptoms    Baseline Wrist flexion 35, extention 44 degrees-- using hand 25% - thumb spica soft most of ime on    Time 4    Period Weeks    Status New    Target Date 06/14/22      OT LONG TERM GOAL #3   Title Thumb palmar/ radial abduction improved within functional limits for patient to use it and turning a doorknob and holding a half a glass with pain less than 2/10    Baseline decrease thumb PA and RA - 45 and 40 degrees - using hand 25 % and keeping thumb splint on most of ime  Time 5    Period Weeks    Status New    Target Date 06/21/22      OT LONG TERM GOAL #4   Title Right grip and prehension strength improve within functional  limits for her age with pain less than or 2/10 for patient to return to more than 75% of using hand    Baseline R grip 15, lat 4 and 3 point 2 lbs - L grip 35, Lat 7 and 3 point 6 lbs - using hand 25% or less -an pain 2-8/10    Time 6    Period Weeks    Status New    Target Date 06/28/22                   Plan - 06/13/22 1832     Clinical Impression Statement Patient had right closed distal radius fracture on 03/01/2022.  Patient is right-hand dominant. Pt made great progress from [facility] in pain as well as wrist and digits AROM and strength.  Patient showed increased wrist AROM in all planes as well as doing 3 sets with  2 pounds in all planes pain-free.  Patient reports increased use.  Grip did improve but prehension strength continues to be limite.  Patient was able to carry 10 pounds and lifting using thumb 4 and 5 pounds.  Was able to initiate some light easy putty for gripping and prehension strength.  Patient limited in functional use of right dominant hand in ADLs and IADLs.  Patient can benefit  from skilled OT services to increase motion and strength without increasing pain to return to prior level of function.    OT Occupational Profile and History Problem Focused Assessment - Including review of records relating to presenting problem    Occupational performance deficits (Please refer to evaluation for details): ADL's;IADL's;Play;Leisure;Social Participation    Body Structure / Function / Physical Skills ADL;FMC;Flexibility;IADL;ROM;UE functional use;Pain;Dexterity;Decreased knowledge of use of DME;Strength    Rehab Potential Good    Clinical Decision Making Limited treatment options, no task modification necessary    Comorbidities Affecting Occupational Performance: None    Modification or Assistance to Complete Evaluation  Min-Moderate modification of tasks or assist with assess necessary to complete eval    OT Frequency 2x / week    OT Duration 6 weeks    OT  Treatment/Interventions Self-care/ADL training;Paraffin;Moist Heat;Fluidtherapy;Therapeutic exercise;Manual Therapy;Patient/family education;Passive range of motion;Therapeutic activities;DME and/or AE instruction;Splinting    Consulted and Agree with Plan of Care Patient             Patient will benefit from skilled therapeutic intervention in order to improve the following deficits and impairments:   Body Structure / Function / Physical Skills: ADL, FMC, Flexibility, IADL, ROM, UE functional use, Pain, Dexterity, Decreased knowledge of use of DME, Strength       Visit Diagnosis: Stiffness of right wrist, not elsewhere classified  Stiffness of right hand, not elsewhere classified  Pain in right hand  Muscle weakness (generalized)    Problem List There are no problems to display for this patient.   Rosalyn Gess, OTR/L,CLT 06/13/2022, 6:36 PM  [facility] [address], [place], [address] Phone: [phone]   Fax:  [phone]  Name: Melissa Preston MRN: [id number] Date of Birth: Apr 25, 1932

## 2022-07-04 ENCOUNTER — Ambulatory Visit: Payer: Medicare Other | Attending: Orthopedic Surgery | Admitting: Occupational Therapy

## 2022-07-04 DIAGNOSIS — M25641 Stiffness of right hand, not elsewhere classified: Secondary | ICD-10-CM | POA: Diagnosis present

## 2022-07-04 DIAGNOSIS — M25631 Stiffness of right wrist, not elsewhere classified: Secondary | ICD-10-CM | POA: Insufficient documentation

## 2022-07-04 DIAGNOSIS — M79641 Pain in right hand: Secondary | ICD-10-CM | POA: Diagnosis present

## 2022-07-04 DIAGNOSIS — M6281 Muscle weakness (generalized): Secondary | ICD-10-CM | POA: Diagnosis present

## 2022-07-04 NOTE — Therapy (Signed)
[facility] [address] [place], [address] Phone: [phone]   Fax:  [phone]  Occupational Therapy Treatment  Patient Details  Name: Melissa Preston MRN: [id number] Date of Birth: 08/07/1932 Referring Provider (OT): [place]   Encounter Date: 07/04/2022   OT End of Session - 07/04/22 1683     Visit Number 7    Number of Visits 9    Date for OT Re-Evaluation 08/15/22    OT Start Time 1200    OT Stop Time 1239    OT Time Calculation (min) 39 min    Activity Tolerance Patient tolerated treatment well    Behavior During Therapy [facility] for tasks assessed/performed             Past Medical History:  Diagnosis Date   Arthritis    neck   Asthma    Cancer ([place])    SKIN CA   Depression    Headache    migraines, sinus   Hypothyroidism    Vertigo     Past Surgical History:  Procedure Laterality Date   ABDOMINAL HYSTERECTOMY     CATARACT EXTRACTION W/PHACO Right 08/26/2016   Procedure: CATARACT EXTRACTION PHACO AND INTRAOCULAR LENS PLACEMENT ([place]);  Surgeon: Ronnell Freshwater, MD;  Location: [place];  Service: Ophthalmology;  Laterality: Right;  right   CATARACT EXTRACTION W/PHACO Left 09/23/2016   Procedure: CATARACT EXTRACTION PHACO AND INTRAOCULAR LENS PLACEMENT ([place])  left;  Surgeon: Ronnell Freshwater, MD;  Location: [place];  Service: Ophthalmology;  Laterality: Left;   COLONOSCOPY     TONSILLECTOMY      There were no vitals filed for this visit.   Subjective Assessment - 07/04/22 1240     Subjective  Since last time done putty and the 2 lbs weight - using it more -I can pick up 1/2 gallon now    Pertinent History Melissa Preston is a 88 y.o. female that 05/09/22 at [facility] clinic for follow up evaluation and management of right distal right distal radius fracture sustained 02/28/2022 . Last seen 04/18/2022 at which time she was kept in a thumb spica cast  due to lingering anatomic snuffbox pain. Xray neg for scaphoid fx but do have [place] artthritis - refer to OT    Patient Stated Goals I would like to be able to use my hand like before I fell in bathing and dressing and using it around the house-and to be able to bowl again    Currently in Pain? Yes    Pain Score 3     Pain Location --   putty prehension   Pain Orientation Right                [facility] OT Assessment - 07/04/22 0001       AROM   Right Wrist Extension 68 Degrees    Right Wrist Flexion 80 Degrees    Right Wrist Radial Deviation 20 Degrees    Right Wrist Ulnar Deviation 30 Degrees      Strength   Right Hand Grip (lbs) 30    Right Hand Lateral Pinch 6 lbs    Right Hand 3 Point Pinch 4 lbs    Left Hand Grip (lbs) 35    Left Hand Lateral Pinch 7 lbs    Left Hand 3 Point Pinch 6 lbs           Pt arrive with CMC neoprene splint -using it -  pt able to pick up 1/2 gallon milk  And turn car key but with forearm- no thumb   Home exercises 10 reps thumb palmar abduction and radial abduction Cont with rubberband for Thumb PA and RA 12 reps 2 x day   Passive range of motion gentle to thumb IP and MC flexion prior to opposition Opposition to all digits- making 0    Cont AAROM for wrist flexion /extension as well as radial /ulnar deviation 10-12 reps. 2 times a day prior to strengthening Cont 3 sets of 2 lbs for wrist and forearm in all planes 2 x day  Cont light blue putty for gripping 15 reps and can do Lat and 3 point 15 reps  Stop when feeling pain  Using CMC neoprene splint for lat and 3 point pinch 15 reps 2 x day    Patient to continue table slides on towel for wrist extention 20 reps pain free- slight pull- prior to 2 lbs weight HEP                        OT Education - 07/04/22 1241     Education Details progress and HEP changes    Person(s) Educated Patient    Methods Explanation;Demonstration;Tactile cues;Verbal cues;Handout     Comprehension Verbal cues required;Returned demonstration;Verbalized understanding              OT Short Term Goals - 07/04/22 1245       OT SHORT TERM GOAL #1   Title Patient to be independent in home program to show increase range of motion in R thumb and wrist with keeping pain under 3/10    Status Achieved               OT Long Term Goals - 07/04/22 1245       OT LONG TERM GOAL #1   Title Right thumb active range of motion for flexion and opposition improved for patient to be able to initiate use of hand and eating, brushing teeth as well as ADLs more than 50%.    Status Achieved      OT LONG TERM GOAL #2   Title Right wrist flexion extension improved to within functional limits for patient to use hand more than 75% in bathing and dressing increase symptoms    Status Achieved      OT LONG TERM GOAL #3   Title Thumb palmar/ radial abduction improved within functional limits for patient to use it and turning a doorknob and holding a half a glass with pain less than 2/10    Status Achieved      OT LONG TERM GOAL #4   Title Right grip and prehension strength improve within functional limits for her age with pain less than or 2/10 for patient to return to more than 75% of using hand    Baseline R grip 15, lat 4 and 3 point 2 lbs - L grip 35, Lat 7 and 3 point 6 lbs - using hand 25% or less -an pain 2-8/10  NOW grip increase to 30 lbs, lat grip 7 lbs and 3 point 4 lbs - but pain 3-4/10 at thumb- recommend use CMC neoprene splint -- use forearm to turn key , cannot do bowling ball -    Time 6    Period Weeks    Status On-going    Target Date 08/15/22  Plan - 07/04/22 1242     Clinical Impression Statement Patient had right closed distal radius fracture on 03/01/2022.  Pt now 4 months out from fall. Patient is right-hand dominant. Pt made great progress from [facility] in pain as well as wrist and digits AROM and strength.  Patient showed increased wrist  AROM in all planes as well as doing 3 sets with  2 pounds in all planes pain-free.  Patient reports increased use. Pt did show improvement in the last 6 wks in grip and prehension strength- but do have pain with prehension strength into light blue putty. Patient was able to carry 10 pounds but heavy and lifting using thumb 5 pounds but 6 has pain.  Recommend for pt to get to Tai Chi, arthritis exercise class.  Patient limited in functional use of right dominant hand in ADLs and IADLs.  Patient can benefit  from skilled OT services to increase motion and strength without increasing pain to return to prior level of function.    OT Occupational Profile and History Problem Focused Assessment - Including review of records relating to presenting problem    Occupational performance deficits (Please refer to evaluation for details): ADL's;IADL's;Play;Leisure;Social Participation    Body Structure / Function / Physical Skills ADL;FMC;Flexibility;IADL;ROM;UE functional use;Pain;Dexterity;Decreased knowledge of use of DME;Strength    Rehab Potential Good    Clinical Decision Making Limited treatment options, no task modification necessary    Comorbidities Affecting Occupational Performance: None    Modification or Assistance to Complete Evaluation  Min-Moderate modification of tasks or assist with assess necessary to complete eval    OT Frequency Monthly    OT Duration 6 weeks    OT Treatment/Interventions Self-care/ADL training;Paraffin;Moist Heat;Fluidtherapy;Therapeutic exercise;Manual Therapy;Patient/family education;Passive range of motion;Therapeutic activities;DME and/or AE instruction;Splinting    Consulted and Agree with Plan of Care Patient             Patient will benefit from skilled therapeutic intervention in order to improve the following deficits and impairments:   Body Structure / Function / Physical Skills: ADL, FMC, Flexibility, IADL, ROM, UE functional use, Pain, Dexterity, Decreased  knowledge of use of DME, Strength       Visit Diagnosis: Stiffness of right wrist, not elsewhere classified  Stiffness of right hand, not elsewhere classified  Muscle weakness (generalized)  Pain in right hand    Problem List There are no problems to display for this patient.   Rosalyn Gess, OTR/L,CLT 07/04/2022, 12:49 PM  [facility] [address] [place], [address] Phone: [phone]   Fax:  [phone]  Name: Melissa Preston MRN: [id number] Date of Birth: October 21, 1931

## 2022-08-01 ENCOUNTER — Ambulatory Visit: Payer: Medicare Other | Attending: Orthopedic Surgery | Admitting: Occupational Therapy

## 2022-08-01 ENCOUNTER — Ambulatory Visit: Payer: Medicare Other | Admitting: Occupational Therapy

## 2022-08-01 DIAGNOSIS — M25631 Stiffness of right wrist, not elsewhere classified: Secondary | ICD-10-CM | POA: Insufficient documentation

## 2022-08-01 DIAGNOSIS — M79641 Pain in right hand: Secondary | ICD-10-CM | POA: Insufficient documentation

## 2022-08-01 DIAGNOSIS — M6281 Muscle weakness (generalized): Secondary | ICD-10-CM | POA: Diagnosis present

## 2022-08-01 DIAGNOSIS — M25641 Stiffness of right hand, not elsewhere classified: Secondary | ICD-10-CM | POA: Diagnosis present

## 2022-08-01 NOTE — Therapy (Signed)
[facility] [address] [place], [address] Phone: [phone]   Fax:  [phone]  Occupational Therapy Treatment  Patient Details  Name: Melissa Preston MRN: [id number] Date of Birth: [birth year] Referring Provider (OT): Chillum [place]   Encounter Date: 08/01/2022   OT End of Session - 08/01/22 1415     Visit Number 8    Number of Visits 9    Date for OT Re-Evaluation 08/15/22    OT Start Time 1117    OT Stop Time 1201    OT Time Calculation (min) 44 min    Activity Tolerance Patient tolerated treatment well    Behavior During Therapy [facility] for tasks assessed/performed             Past Medical History:  Diagnosis Date   Arthritis    neck   Asthma    Cancer ([place])    SKIN CA   Depression    Headache    migraines, sinus   Hypothyroidism    Vertigo     Past Surgical History:  Procedure Laterality Date   ABDOMINAL HYSTERECTOMY     CATARACT EXTRACTION W/PHACO Right 08/26/2016   Procedure: CATARACT EXTRACTION PHACO AND INTRAOCULAR LENS PLACEMENT ([place]);  Surgeon: Ronnell Freshwater, MD;  Location: [facility];  Service: Ophthalmology;  Laterality: Right;  right   CATARACT EXTRACTION W/PHACO Left 09/23/2016   Procedure: CATARACT EXTRACTION PHACO AND INTRAOCULAR LENS PLACEMENT ([place])  left;  Surgeon: Ronnell Freshwater, MD;  Location: [facility];  Service: Ophthalmology;  Laterality: Left;   COLONOSCOPY     TONSILLECTOMY      There were no vitals filed for this visit.   Subjective Assessment - 08/01/22 1118     Subjective  Doing okay.  Done the 2 pound weight.  I can pick up half a gallon of milk.  Still using my forearm to turn the key.  Wrist doing okay but my thumb still bothers me.    Pertinent History Melissa Preston is a 88 y.o. female that 05/09/22 at [facility] clinic for follow up evaluation and management of right distal right distal radius fracture sustained 02/28/2022  . Last seen 04/18/2022 at which time she was kept in a thumb spica cast due to lingering anatomic snuffbox pain. Xray neg for scaphoid fx but do have [place] artthritis - refer to OT    Patient Stated Goals I would like to be able to use my hand like before I fell in bathing and dressing and using it around the house-and to be able to bowl again    Currently in Pain? Yes    Pain Score 3     Pain Location --   thumb   Pain Orientation Right    Pain Descriptors / Indicators Aching    Pain Type Chronic pain    Pain Onset More than a month ago                [facility] OT Assessment - 08/01/22 0001       Strength   Right Hand Grip (lbs) 33    Right Hand Lateral Pinch 7 lbs    Right Hand 3 Point Pinch 4 lbs    Left Hand Grip (lbs) 35    Left Hand Lateral Pinch 6 lbs    Left Hand 3 Point Pinch 6 lbs            Grip increase but prehension about same and  pain at thumb CMC   Pt arrive with [facility] neoprene splint -using it - pt able to pick up 1/2 gallon milk  And turn car key but with forearm- no thumb    Recommended for patient to get a paraffin bath to use at home in the mornings for stiffness and pain.  And for the evenings to decrease pain.  Patient is in the morning to continue with tendon glides as well as opposition to maintain motion.  Patient to hold off on any 2 pound weight strengthening as well as putty.  Recommend to use her CMC neoprene splint for functional strengthening during ADLs and IADLs.   Patient tender over thumb CMC as well as positive grinding test.   Patient wants to follow-up in 4-8 weeks             OT Treatments/Exercises (OP) - 08/01/22 0001       RUE Paraffin   Number Minutes Paraffin 8 Minutes    RUE Paraffin Location Hand    Comments Decreased pain and increased range of motion in the thumb.           Patient with decreased pain after paraffin as well as less pain with lateral and 3-point pinch.         OT Education - 08/01/22 1415      Education Details Changes to home program and recommendations for paraffin at home    Person(s) Educated Patient    Methods Explanation;Demonstration;Tactile cues;Verbal cues;Handout    Comprehension Verbal cues required;Returned demonstration;Verbalized understanding              OT Short Term Goals - 07/04/22 1245       OT SHORT TERM GOAL #1   Title Patient to be independent in home program to show increase range of motion in R thumb and wrist with keeping pain under 3/10    Status Achieved               OT Long Term Goals - 07/04/22 1245       OT LONG TERM GOAL #1   Title Right thumb active range of motion for flexion and opposition improved for patient to be able to initiate use of hand and eating, brushing teeth as well as ADLs more than 50%.    Status Achieved      OT LONG TERM GOAL #2   Title Right wrist flexion extension improved to within functional limits for patient to use hand more than 75% in bathing and dressing increase symptoms    Status Achieved      OT LONG TERM GOAL #3   Title Thumb palmar/ radial abduction improved within functional limits for patient to use it and turning a doorknob and holding a half a glass with pain less than 2/10    Status Achieved      OT LONG TERM GOAL #4   Title Right grip and prehension strength improve within functional limits for her age with pain less than or 2/10 for patient to return to more than 75% of using hand    Baseline R grip 15, lat 4 and 3 point 2 lbs - L grip 35, Lat 7 and 3 point 6 lbs - using hand 25% or less -an pain 2-8/10  NOW grip increase to 30 lbs, lat grip 7 lbs and 3 point 4 lbs - but pain 3-4/10 at thumb- recommend use CMC neoprene splint -- use forearm to turn key , cannot do bowling ball -  Time 6    Period Weeks    Status On-going    Target Date 08/15/22                   Plan - 08/01/22 1415     Clinical Impression Statement Patient had right closed distal radius fracture on  03/01/2022.  Pt now 5 months out from fall. Patient is right-hand dominant. Pt made great progress from [facility] in pain as well as wrist and digits AROM and strength.  Patient is active range of motion in wrist and forearm within functional limits.  Strength of wrist and forearm in all planes 5/5.  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 Patient was able to carry 10 pounds but heavy and lifting using thumb with wide grip still 5 pounds - pain with 6lbs. recommend for patient to get a pair for left to use at home.  Patient recovered great with wrist fracture but limited by some CMC and grip and prehension as well as lifting and pulling and pushing.  Patient had less pain after paraffin.  Information provided.  Patient can follow-up with me in the next month or 2 if needed.    OT Occupational Profile and History Problem Focused Assessment - Including review of records relating to presenting problem    Occupational performance deficits (Please refer to evaluation for details): ADL's;IADL's;Play;Leisure;Social Participation    Body Structure / Function / Physical Skills ADL;FMC;Flexibility;IADL;ROM;UE functional use;Pain;Dexterity;Decreased knowledge of use of DME;Strength    Rehab Potential Good    Clinical Decision Making Limited treatment options, no task modification necessary    Comorbidities Affecting Occupational Performance: None    Modification or Assistance to Complete Evaluation  Min-Moderate modification of tasks or assist with assess necessary to complete eval    OT Frequency Monthly    OT Duration 6 weeks    Consulted and Agree with Plan of Care Patient             Patient will benefit from skilled therapeutic intervention in order to improve the following deficits and impairments:   Body Structure / Function / Physical Skills: ADL, FMC, Flexibility, IADL, ROM, UE functional use, Pain, Dexterity, Decreased knowledge of use of DME, Strength       Visit Diagnosis: Stiffness of  right wrist, not elsewhere classified  Stiffness of right hand, not elsewhere classified  Muscle weakness (generalized)  Pain in right hand    Problem List There are no problems to display for this patient.   Rosalyn Gess, OTR/L,CLT 08/01/2022, 2:19 PM  [facility] [address], [place], [address] Phone: [phone]   Fax:  [phone]  Name: Anishka Bushard MRN: [id number] Date of Birth: 1932/08/14

## 2023-11-20 IMAGING — MG MM DIGITAL SCREENING BILAT W/ TOMO AND CAD
8 series · 9 of 24 positions shown · non-contrast
Comparison: Previous exam(s).

CLINICAL DATA: Screening.

EXAM:
DIGITAL SCREENING BILATERAL MAMMOGRAM WITH TOMOSYNTHESIS AND CAD
TECHNIQUE: Bilateral screening digital craniocaudal and mediolateral oblique
mammograms were obtained. Bilateral screening digital breast
tomosynthesis was performed. The images were evaluated with
computer-aided detection.

[L MLO synth-2D]
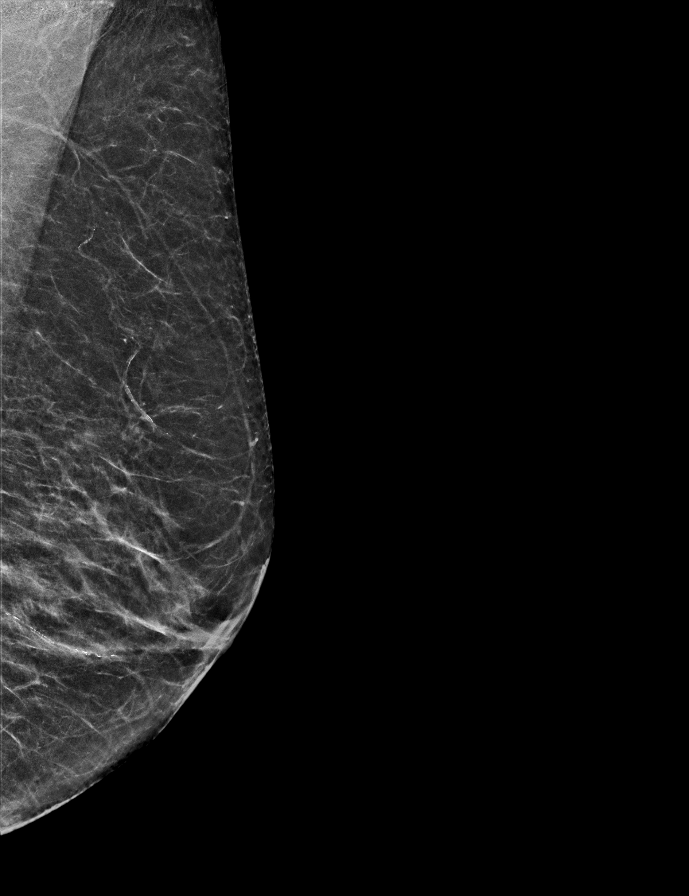

[L CC synth-2D]
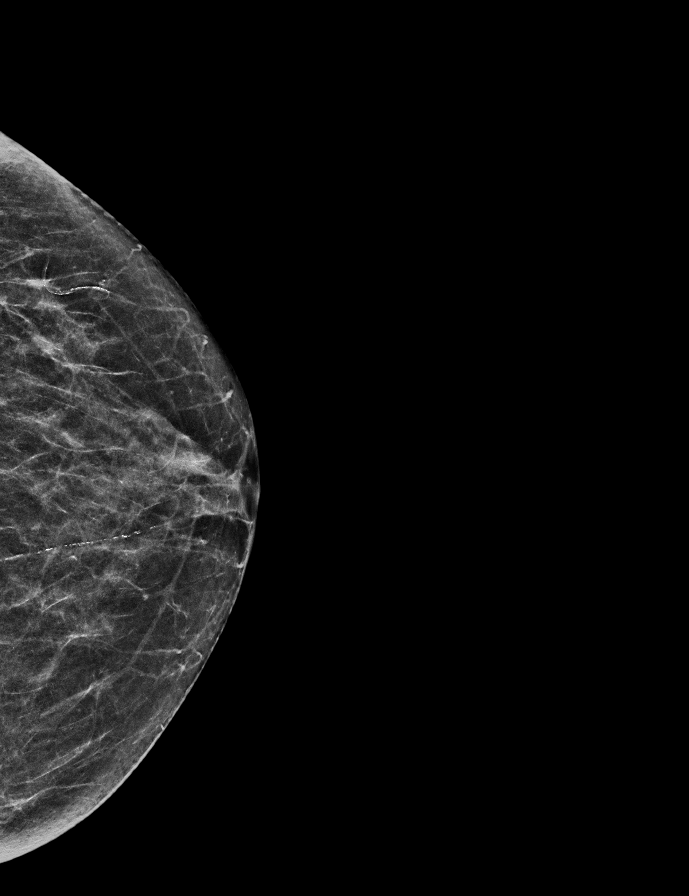

[R MLO synth-2D]
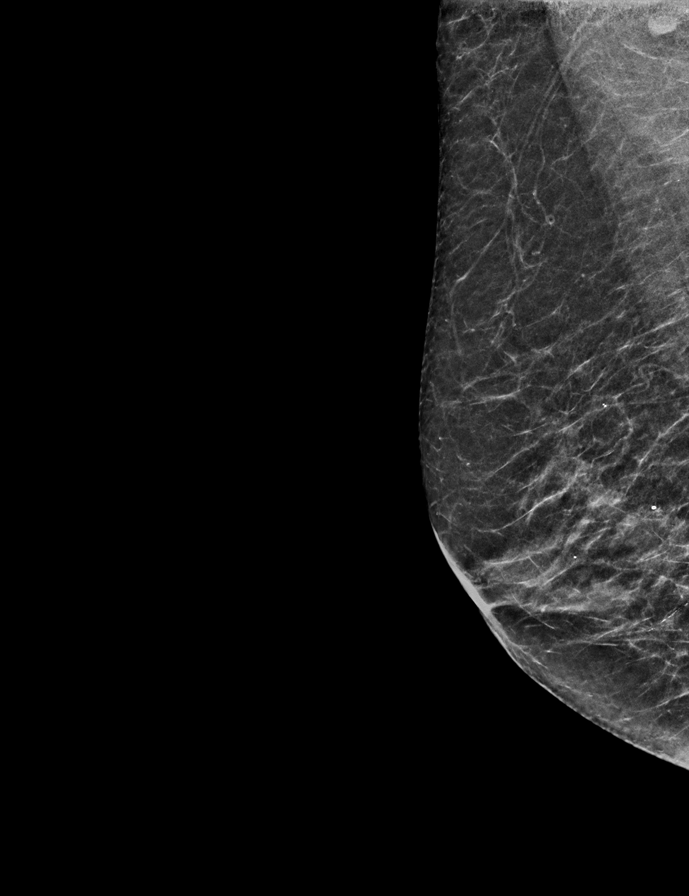

[R CC synth-2D]
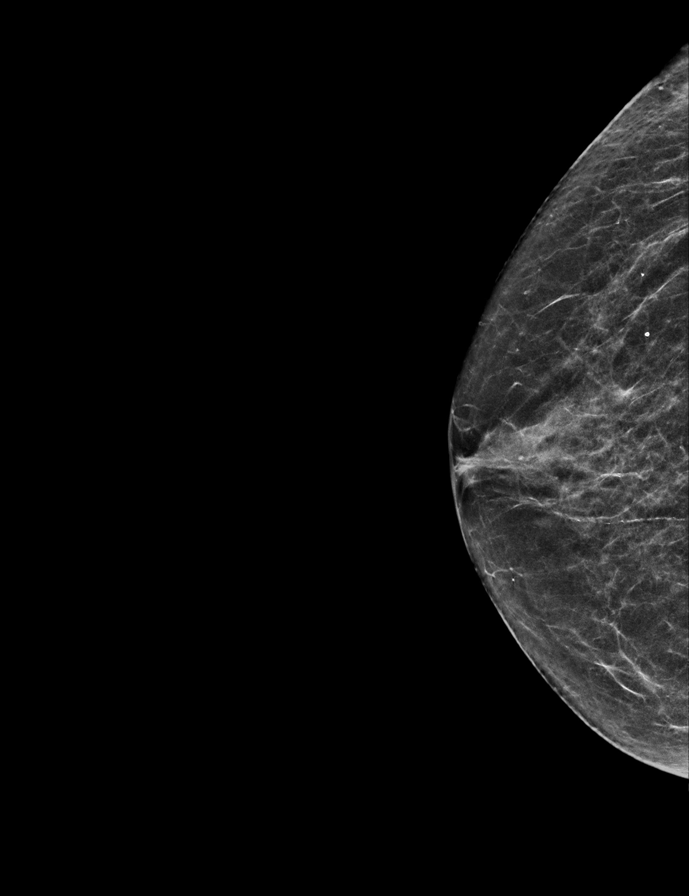

[L MLO tomo · 2 of 52 frames shown]
[frame 17/52]
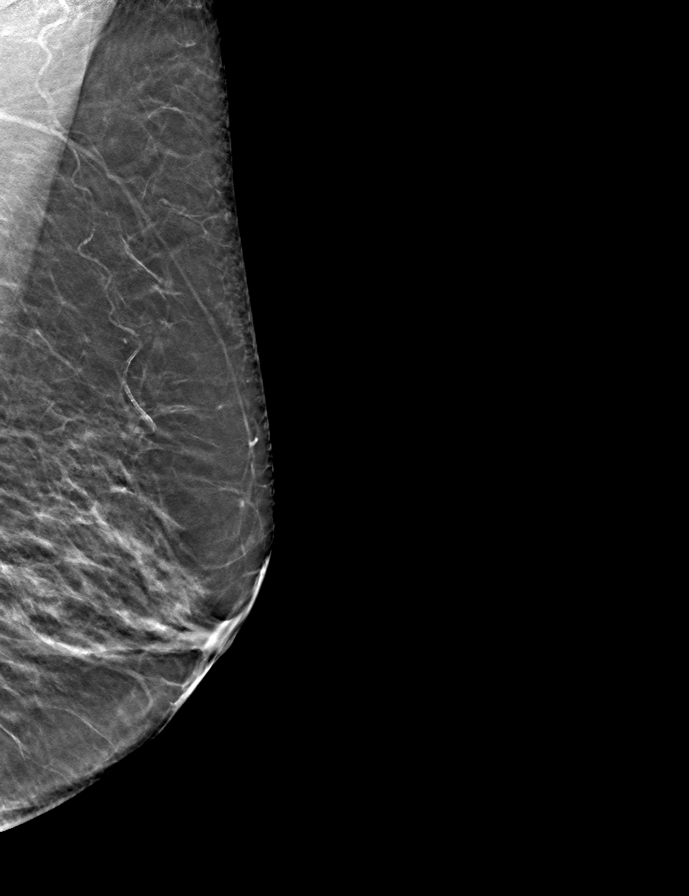
[frame 27/52]
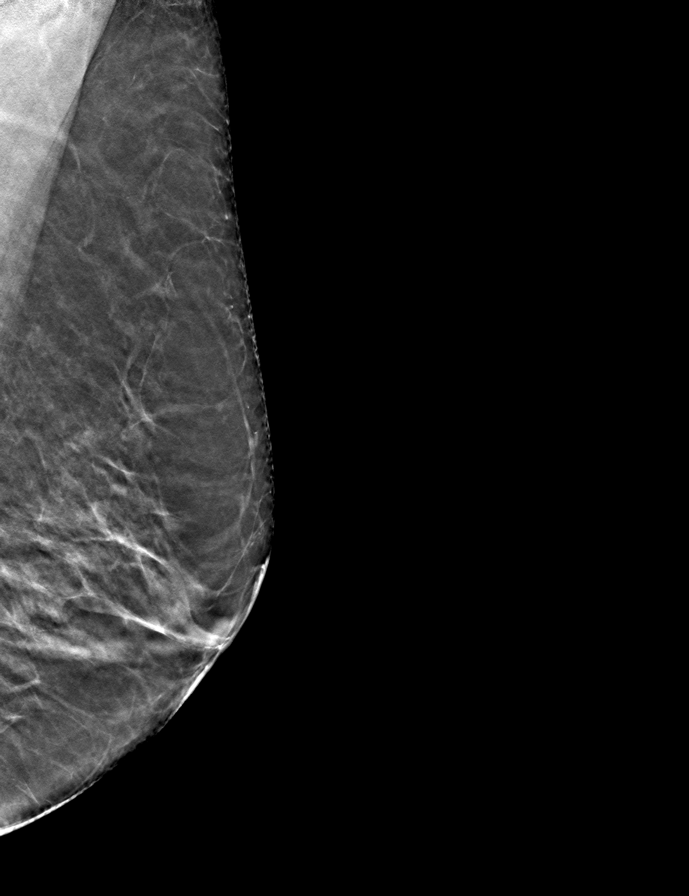

[L CC tomo · tomo slice 25/50.0]
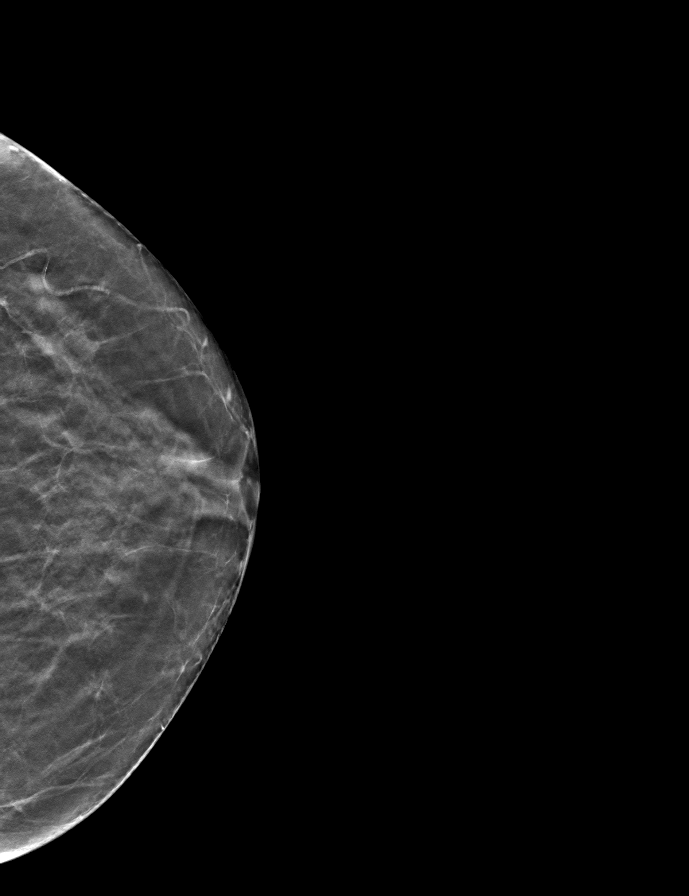

[R CC tomo · tomo slice 26/51.0]
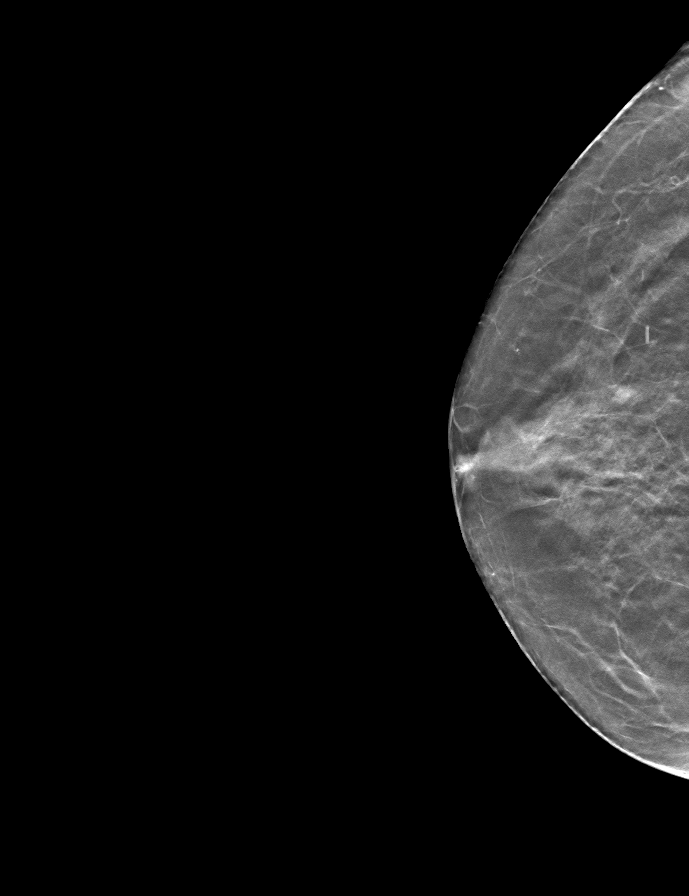

[R MLO tomo · tomo slice 25/49.0]
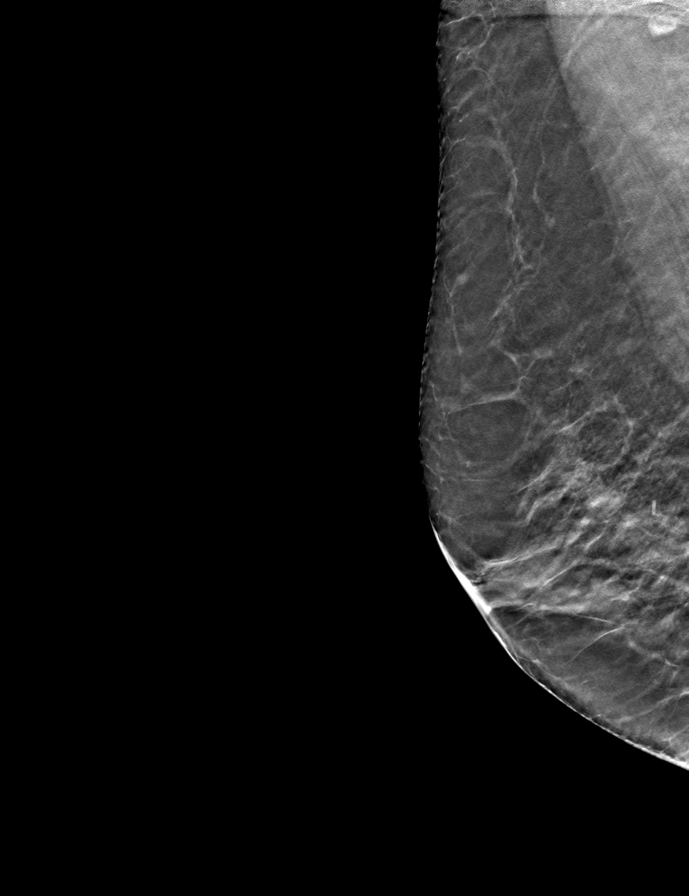

[9 of 24 positions shown; findings below may reference images not displayed]

ACR Breast Density Category b: There are scattered areas of
fibroglandular density.
FINDINGS: There are no findings suspicious for malignancy.
IMPRESSION: No mammographic evidence of malignancy. A result letter of this
screening mammogram will be mailed directly to the patient.

RECOMMENDATION:
Screening mammogram in one year. (Code:51-O-LD2)

BI-RADS CATEGORY  1: Negative.
